# Patient Record
Sex: Male | Born: 1970 | Marital: Married | Smoking: Never smoker
Health system: Southern US, Community
[De-identification: ages and names within clinical notes are randomized; demographics above are authoritative.]

## PROBLEM LIST (undated history)

## (undated) DIAGNOSIS — C4491 Basal cell carcinoma of skin, unspecified: Secondary | ICD-10-CM

## (undated) DIAGNOSIS — I10 Essential (primary) hypertension: Secondary | ICD-10-CM

## (undated) DIAGNOSIS — F419 Anxiety disorder, unspecified: Secondary | ICD-10-CM

## (undated) DIAGNOSIS — E291 Testicular hypofunction: Secondary | ICD-10-CM

## (undated) HISTORY — PX: KNEE ARTHROSCOPY: SHX127

## (undated) HISTORY — DX: Basal cell carcinoma of skin, unspecified: C44.91

## (undated) HISTORY — DX: Testicular hypofunction: E29.1

## (undated) HISTORY — DX: Essential (primary) hypertension: I10

## (undated) HISTORY — DX: Anxiety disorder, unspecified: F41.9

## (undated) HISTORY — PX: VARICOCELE EXCISION: SUR582

---

## 2002-12-24 ENCOUNTER — Ambulatory Visit (HOSPITAL_BASED_OUTPATIENT_CLINIC_OR_DEPARTMENT_OTHER): Admission: RE | Admit: 2002-12-24 | Discharge: 2002-12-24 | Payer: Self-pay | Admitting: Urology

## 2002-12-24 ENCOUNTER — Ambulatory Visit (HOSPITAL_COMMUNITY): Admission: RE | Admit: 2002-12-24 | Discharge: 2002-12-24 | Payer: Self-pay | Admitting: Urology

## 2005-05-02 ENCOUNTER — Emergency Department (HOSPITAL_COMMUNITY): Admission: EM | Admit: 2005-05-02 | Discharge: 2005-05-02 | Payer: Self-pay | Admitting: Family Medicine

## 2006-04-17 ENCOUNTER — Emergency Department (HOSPITAL_COMMUNITY): Admission: EM | Admit: 2006-04-17 | Discharge: 2006-04-17 | Payer: Self-pay | Admitting: Family Medicine

## 2012-05-04 ENCOUNTER — Encounter (INDEPENDENT_AMBULATORY_CARE_PROVIDER_SITE_OTHER): Payer: Self-pay | Admitting: Surgery

## 2012-05-04 ENCOUNTER — Ambulatory Visit (INDEPENDENT_AMBULATORY_CARE_PROVIDER_SITE_OTHER): Payer: BC Managed Care – PPO | Admitting: Surgery

## 2012-05-04 VITALS — BP 130/82 | HR 60 | Temp 97.1°F | Resp 16 | Ht 72.0 in | Wt 219.0 lb

## 2012-05-04 DIAGNOSIS — K648 Other hemorrhoids: Secondary | ICD-10-CM

## 2012-05-04 MED ORDER — HYDROCORTISONE ACETATE 25 MG RE SUPP: 25.0000 mg | Freq: Two times a day (BID) | RECTAL | Status: DC

## 2012-05-04 NOTE — Patient Instructions (Signed)
ANORECTAL PROCEDURES: 1.  Tub soaks 2-3 times daily in warm water (may add Epsom salts if desired) 2.  Stool softener for one month (store brand Miralax or Colace) 3.  Avoid toilet paper - use baby wipes or Tucks pads 4.  Increase water intake - 6-8 glasses daily 5.  Apply dry pad to area until drainage stops 

## 2012-05-04 NOTE — Progress Notes (Signed)
General Surgery [facility]  Chief Complaint  Patient presents with  . New Evaluation    eval rectal bleeding    HISTORY: Patient is a 42 year old white male who presents on self referral with rectal bleeding. Patient has noted intermittent bleeding for 4-5 weeks associated with bowel movements. He denies pain. He has had no prior history of anorectal surgery. He denies fevers or chills. He presents today for evaluation.  History reviewed. No pertinent past medical history.   Current Outpatient Prescriptions  Medication Sig Dispense Refill  . Ascorbic Acid (VITAMIN C) 100 MG tablet Take 100 mg by mouth daily.      . folic acid (FOLVITE) 1 MG tablet Take 1 mg by mouth daily.      Marland Kitchen thiamine (VITAMIN B-1) 100 MG tablet Take 100 mg by mouth daily.      . vitamin E 100 UNIT capsule Take 100 Units by mouth daily.       No current facility-administered medications for this visit.     No Known Allergies   Family History  Problem Relation Age of Onset  . Cancer Paternal Aunt     breast  . Cancer Paternal Uncle     lung  . Cancer Paternal Uncle     brain  . Cancer Paternal Uncle     unknown to pt     History   Social History  . Marital Status: Married    Spouse Name: N/A    Number of Children: N/A  . Years of Education: N/A   Social History Main Topics  . Smoking status: Former Games developer  . Smokeless tobacco: Current User  . Alcohol Use: Yes  . Drug Use: No  . Sexually Active: None   Other Topics Concern  . None   Social History Narrative  . None     REVIEW OF SYSTEMS - PERTINENT POSITIVES ONLY: Leading with bowel movements, sometimes heavy. No bleeding between bowel movements. Denies fever. Denies pain.  EXAM: Filed Vitals:   05/04/12 1506  BP: 130/82  Pulse: 60  Temp: 97.1 F (36.2 C)  Resp: 16    HEENT: normocephalic; pupils equal and reactive; sclerae clear; dentition good; mucous membranes moist NECK:  symmetric on extension;  no palpable anterior or posterior cervical lymphadenopathy; no supraclavicular masses; no tenderness CHEST: clear to auscultation bilaterally without rales, rhonchi, or wheezes CARDIAC: regular rate and rhythm without significant murmur; peripheral pulses are full GU:  External exam shows normal anoderm. No sign of fistula or fissure. No significant external hemorrhoids. Digital rectal exam shows normal tone. No palpable masses. Soft brown stool present. Prostate is normal without nodule or mass. Anoscopy is performed and there are significant grade 2 internal hemorrhoids circumferentially. In the left lateral position there is a prominent hemorrhoidal column with active bleeding. Rubber band ligation is performed without discomfort. Bleeding is controlled. EXT:  non-tender without edema; no deformity NEURO: no gross focal deficits; no sign of tremor   LABORATORY RESULTS: See [facility] ([facility]Epic) for most recent results   RADIOLOGY RESULTS: See [facility] ([facility]Epic) for most recent results   IMPRESSION: Internal hemorrhoids, grade 2, with bleeding  PLAN: Patient is instructed in dietary changes for management of hemorrhoids. I have provided him with written literature to review. I am going to prescribe Anusol HC suppositories to use twice daily for 10 days. I have given him instructions for what to expect with rubber band ligation. I would like to evaluate him again in  4-6 weeks. Hopefully we can control his symptomatic hemorrhoids with office procedures. Otherwise he may become a candidate for the [facility] procedure as an outpatient surgery.  Patient will return for followup in 4 weeks.  Velora Heckler, MD, FACS General & Endocrine Surgery [facility]   Visit Diagnoses: 1. Hemorrhoids, internal, with bleeding     Primary Care Physician: No primary provider on file.

## 2012-06-07 ENCOUNTER — Ambulatory Visit (INDEPENDENT_AMBULATORY_CARE_PROVIDER_SITE_OTHER): Payer: BC Managed Care – PPO | Admitting: Surgery

## 2012-09-24 ENCOUNTER — Encounter: Payer: Self-pay | Admitting: Family Medicine

## 2012-09-24 ENCOUNTER — Ambulatory Visit (INDEPENDENT_AMBULATORY_CARE_PROVIDER_SITE_OTHER): Payer: BC Managed Care – PPO | Admitting: Family Medicine

## 2012-09-24 VITALS — BP 120/78 | HR 60 | Temp 98.6°F | Resp 14 | Ht 71.5 in | Wt 216.0 lb

## 2012-09-24 DIAGNOSIS — R5381 Other malaise: Secondary | ICD-10-CM

## 2012-09-24 DIAGNOSIS — M549 Dorsalgia, unspecified: Secondary | ICD-10-CM

## 2012-09-24 DIAGNOSIS — Z0000 Encounter for general adult medical examination without abnormal findings: Secondary | ICD-10-CM

## 2012-09-24 DIAGNOSIS — Z23 Encounter for immunization: Secondary | ICD-10-CM

## 2012-09-24 NOTE — Progress Notes (Signed)
Subjective:    Patient ID: Miguel Mathis, male    DOB: [birth year], 42 y.o.   MRN: [id number]  HPI Patient is a 42 year old white male here today to establish care and for physical exam.  He's been having mid back pain around the level of T12 the last month. The patient injured his back lifting a package at work. He didn't give it much thought initially. However now he complains of a burning aching pain in his back at times.  He denies any neuropathy, sciatica, leg weakness, or cauda equina symptoms. He also complains of just generalized fatigue. He has poor libido. He also has some anhedonia. He states he did not have much drive or motivation. Otherwise he feels well.  Past Medical History  Diagnosis Date  . Basal cell cancer    Past Surgical History  Procedure Laterality Date  . Knee arthroscopy     No current outpatient prescriptions on file prior to visit.   No current facility-administered medications on file prior to visit.   No Known Allergies History   Social History  . Marital Status: Married    Spouse Name: N/A    Number of Children: N/A  . Years of Education: N/A   Occupational History  . Not on file.   Social History Main Topics  . Smoking status: Never Smoker   . Smokeless tobacco: Current User    Types: Snuff  . Alcohol Use: 3.6 oz/week    6 Cans of beer per week  . Drug Use: No  . 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 Not on file   Social History Narrative  . No narrative on file   Family History  Problem Relation Age of Onset  . Cancer Paternal Aunt     breast  . Cancer Paternal Uncle     lung  . Cancer Paternal Uncle     brain  . Cancer Paternal Uncle     unknown to pt      Review of Systems  All other systems reviewed and are negative.       Objective:   Physical Exam  Vitals reviewed. Constitutional: He is oriented to person, place, and time. He appears well-developed and  well-nourished. No distress.  HENT:  Head: Normocephalic and atraumatic.  Right Ear: External ear normal.  Left Ear: External ear normal.  Nose: Nose normal.  Mouth/Throat: Oropharynx is clear and moist. No oropharyngeal exudate.  Eyes: Conjunctivae and EOM are normal. Pupils are equal, round, and reactive to light. Right eye exhibits no discharge. Left eye exhibits no discharge. No scleral icterus.  Neck: Normal range of motion. Neck supple. No JVD present. No tracheal deviation present. No thyromegaly present.  Cardiovascular: Normal rate, regular rhythm, normal heart sounds and intact distal pulses.  Exam reveals no gallop and no friction rub.   No murmur heard. Pulmonary/Chest: Effort normal and breath sounds normal. No stridor. No respiratory distress. He has no wheezes. He has no rales. He exhibits no tenderness.  Abdominal: Soft. Bowel sounds are normal. He exhibits no distension and no mass. There is no tenderness. There is no rebound and no guarding.  Genitourinary: Penis normal. Guaiac negative stool. No penile tenderness.  Musculoskeletal: Normal range of motion. He exhibits no edema and no tenderness.  Lymphadenopathy:    He has no cervical adenopathy.  Neurological: He is alert and oriented to person, place, and time. He has  normal reflexes. He displays normal reflexes. No cranial nerve deficit. He exhibits normal muscle tone. Coordination normal.  Skin: Skin is warm. No rash noted. He is not diaphoretic. No erythema. No pallor.  Psychiatric: He has a normal mood and affect. His behavior is normal. Judgment and thought content normal.          Assessment & Plan:  1. Routine general medical examination at a health care facility Physical exam is within normal limits. The patient would like to get a TdaP today and we will accommodate him. - Lipid panel; Future  2. Other malaise and fatigue Right the patient come back for some screening labs as listed below. I also did  discuss with the patient about possible pression. He states is a possibility that he would like to obtain the lab work first. - COMPLETE METABOLIC PANEL WITH GFR; Future - CBC with Differential; Future - TSH; Future - Testosterone; Future - Vitamin B12; Future - DG Thoracic Spine W/Swimmers; Future  3. Back pain, subacute Patient's history and physical are consistent with subacute back pain due to a muscle strain. I did recommend obtaining a thoracic spine x-ray to rule out degenerative disc disease or other skeletal pathology. - DG Thoracic Spine W/Swimmers; Future

## 2012-09-25 ENCOUNTER — Other Ambulatory Visit: Payer: BC Managed Care – PPO

## 2012-09-25 ENCOUNTER — Ambulatory Visit
Admission: RE | Admit: 2012-09-25 | Discharge: 2012-09-25 | Disposition: A | Discharge status: Home / Self Care | Payer: BC Managed Care – PPO | Source: Ambulatory Visit | Attending: Family Medicine | Admitting: Family Medicine

## 2012-09-25 DIAGNOSIS — Z0000 Encounter for general adult medical examination without abnormal findings: Secondary | ICD-10-CM

## 2012-09-25 DIAGNOSIS — R5381 Other malaise: Secondary | ICD-10-CM

## 2012-09-25 DIAGNOSIS — M549 Dorsalgia, unspecified: Secondary | ICD-10-CM

## 2012-09-25 LAB — LIPID PANEL
Cholesterol: 224 mg/dL — ABNORMAL HIGH (ref 0–200)
Total CHOL/HDL Ratio: 2.2 Ratio
VLDL: 11 mg/dL (ref 0–40)

## 2012-09-25 LAB — CBC WITH DIFFERENTIAL/PLATELET
Eosinophils Absolute: 0 10*3/uL (ref 0.0–0.7)
Eosinophils Relative: 1 % (ref 0–5)
HCT: 44.6 % (ref 39.0–52.0)
Hemoglobin: 16 g/dL (ref 13.0–17.0)
Lymphs Abs: 0.9 10*3/uL (ref 0.7–4.0)
MCH: 31.8 pg (ref 26.0–34.0)
MCV: 88.7 fL (ref 78.0–100.0)
Monocytes Relative: 14 % — ABNORMAL HIGH (ref 3–12)
Neutrophils Relative %: 67 % (ref 43–77)
RBC: 5.03 MIL/uL (ref 4.22–5.81)

## 2012-09-25 LAB — COMPLETE METABOLIC PANEL WITH GFR
ALT: 25 U/L (ref 0–53)
BUN: 14 mg/dL (ref 6–23)
CO2: 27 mEq/L (ref 19–32)
Chloride: 102 mEq/L (ref 96–112)
Glucose, Bld: 100 mg/dL — ABNORMAL HIGH (ref 70–99)
Potassium: 4.9 mEq/L (ref 3.5–5.3)
Sodium: 139 mEq/L (ref 135–145)

## 2012-09-25 LAB — TESTOSTERONE: Testosterone: 419 ng/dL (ref 300–890)

## 2012-09-29 ENCOUNTER — Encounter: Payer: Self-pay | Admitting: Family Medicine

## 2014-11-08 IMAGING — CR DG THORACIC SPINE 3V
3 series · 3 of 3 positions shown · non-contrast
Comparison: None.

CLINICAL DATA: Mid back pain without trauma

THORACIC SPINE - 2 VIEW + SWIMMERS

[t t-spine a.p.]
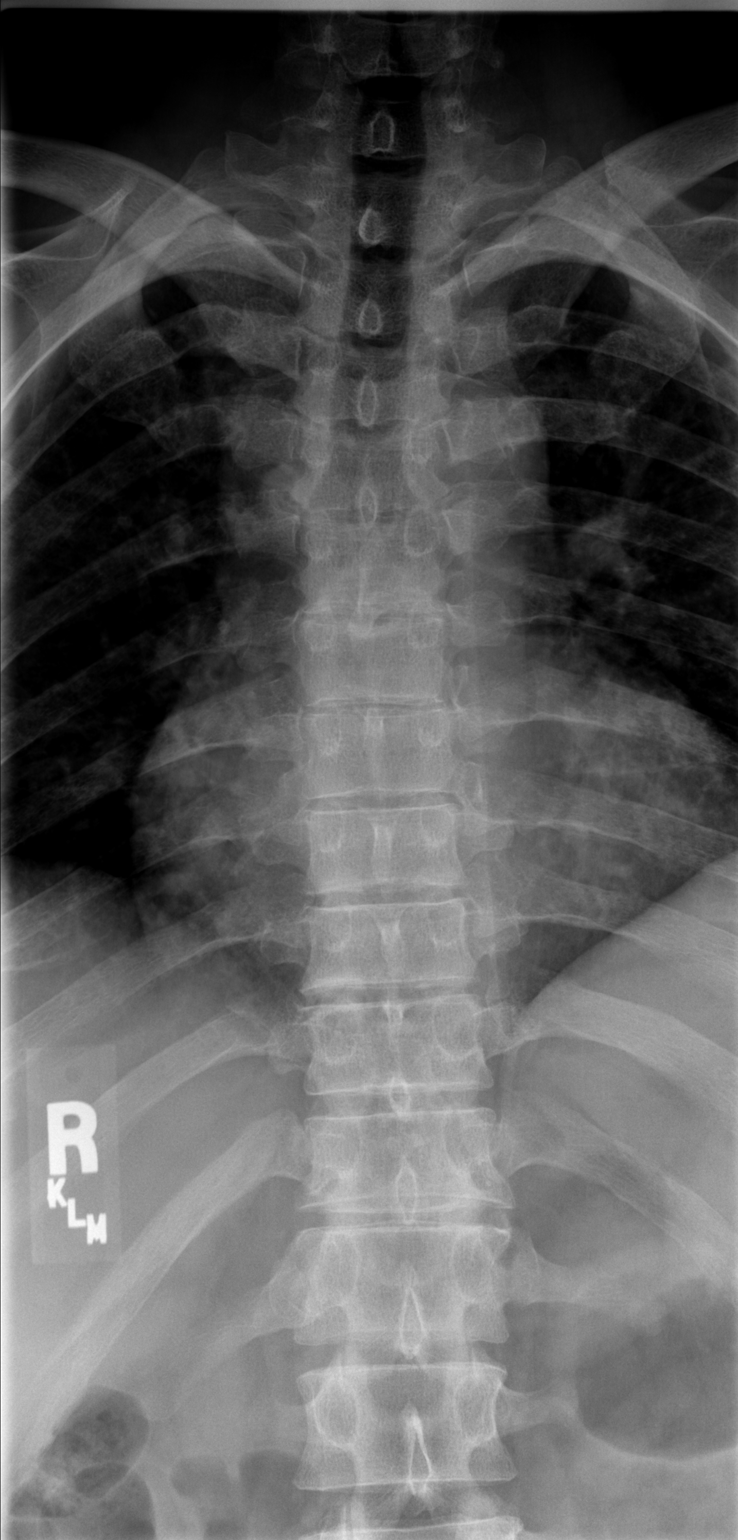

[t t-spine lat *]
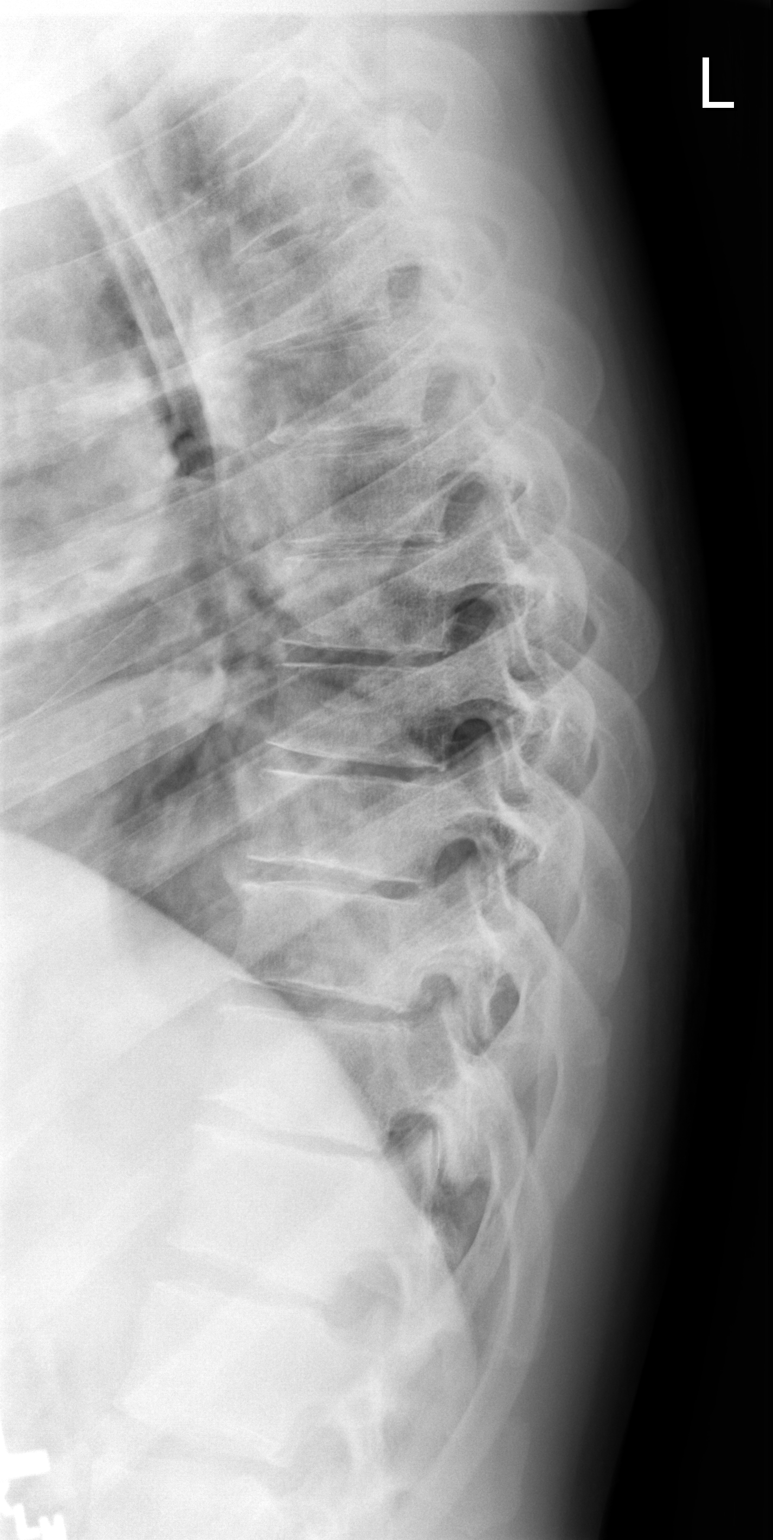

[t swimmers]
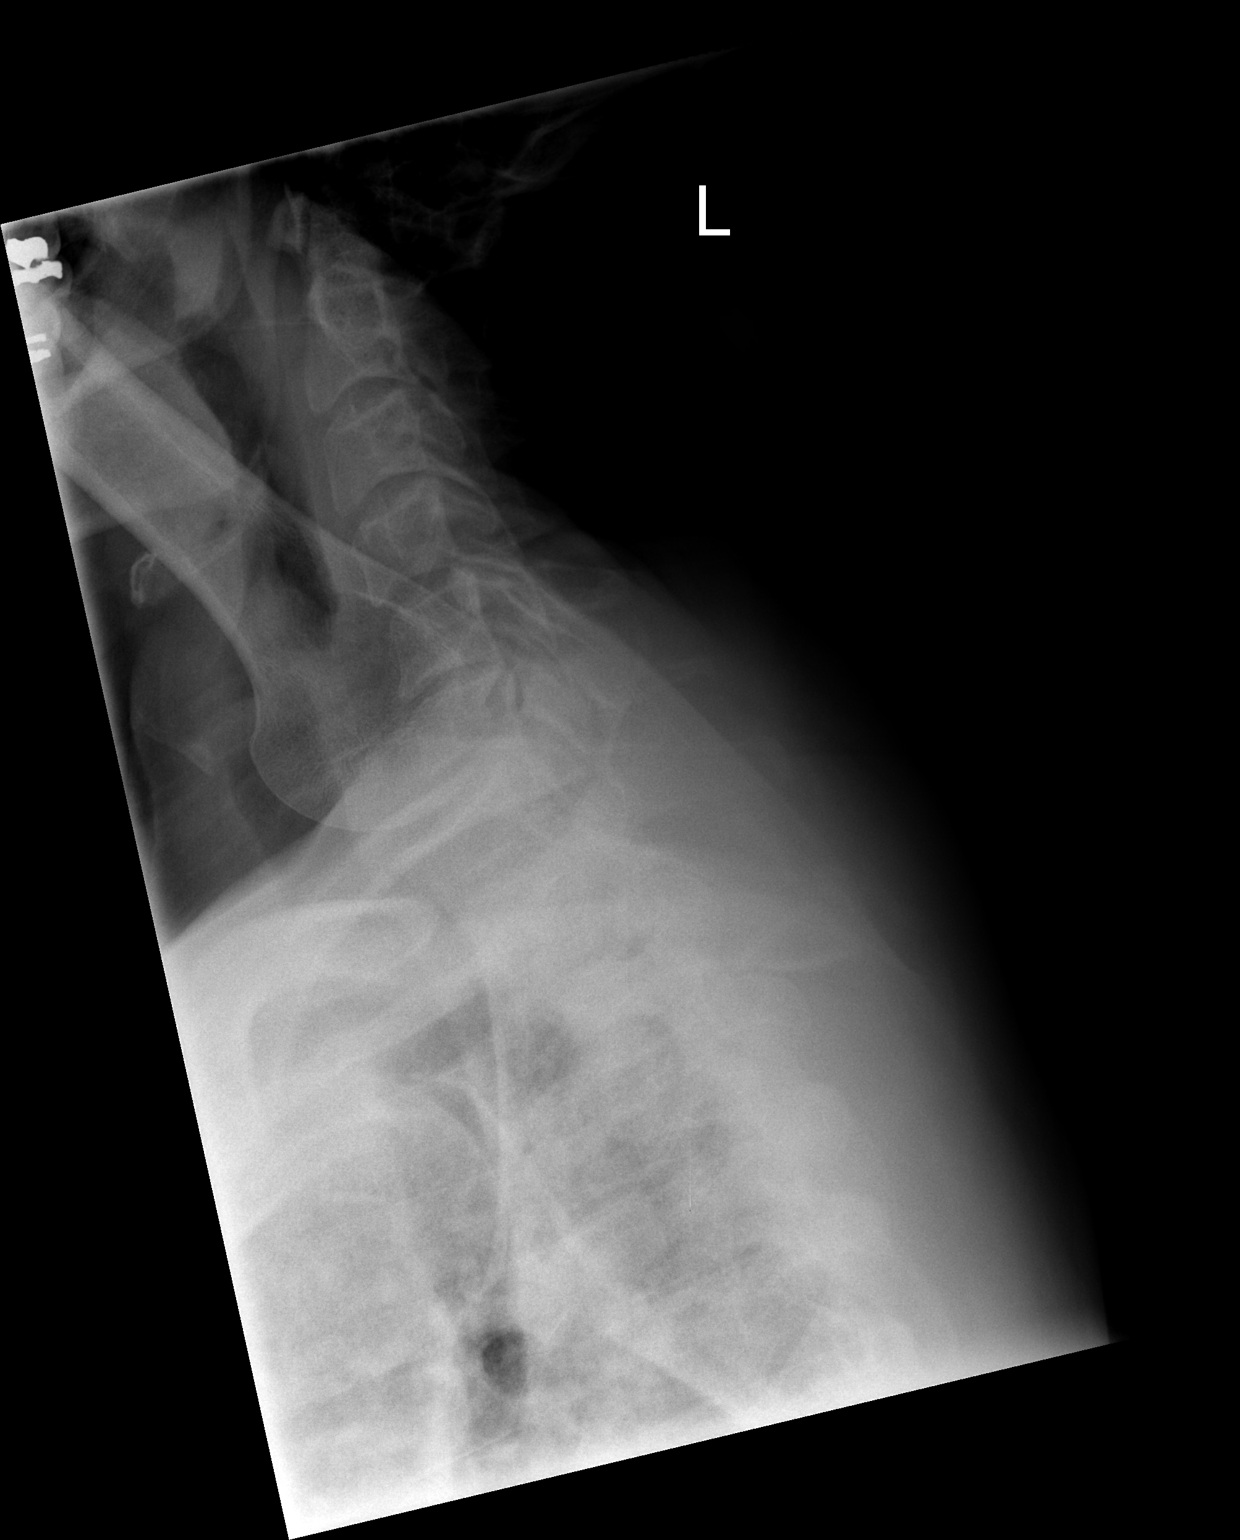

[3 of 3 positions shown; findings below may reference images not displayed]

FINDINGS: Minimal convex right thoracic spine curvature.  Normal
paraspinous contours.

The lateral view images from approximately mid T2 through bottom of
L1.  Maintenance of vertebral body height across these levels.
Mild multilevel spondylosis.  The swimmer's view demonstrates
grossly maintained T1 vertebral body height.
IMPRESSION: Mild spondylosis and spinal curvature. No acute osseous
abnormality.

## 2015-09-16 ENCOUNTER — Other Ambulatory Visit: Payer: Self-pay

## 2015-09-18 ENCOUNTER — Encounter: Payer: Self-pay | Admitting: Family Medicine

## 2015-10-16 ENCOUNTER — Other Ambulatory Visit: Payer: BLUE CROSS/BLUE SHIELD

## 2015-10-16 DIAGNOSIS — Z0000 Encounter for general adult medical examination without abnormal findings: Secondary | ICD-10-CM

## 2015-10-16 LAB — COMPLETE METABOLIC PANEL WITH GFR
ALBUMIN: 4.4 g/dL (ref 3.6–5.1)
ALK PHOS: 70 U/L (ref 40–115)
ALT: 44 U/L (ref 9–46)
AST: 24 U/L (ref 10–40)
BILIRUBIN TOTAL: 0.8 mg/dL (ref 0.2–1.2)
BUN: 22 mg/dL (ref 7–25)
CO2: 24 mmol/L (ref 20–31)
CREATININE: 1.11 mg/dL (ref 0.60–1.35)
Calcium: 9.4 mg/dL (ref 8.6–10.3)
Chloride: 103 mmol/L (ref 98–110)
GFR, Est African American: >89 mL/min (ref >=60)
GFR, Est Non African American: 80 mL/min (ref >=60)
GLUCOSE: 91 mg/dL (ref 70–99)
POTASSIUM: 4.5 mmol/L (ref 3.5–5.3)
SODIUM: 136 mmol/L (ref 135–146)
TOTAL PROTEIN: 7 g/dL (ref 6.1–8.1)

## 2015-10-16 LAB — CBC WITH DIFFERENTIAL/PLATELET
Basophils Absolute: 0 cells/uL (ref 0–200)
Basophils Relative: 0 %
EOS PCT: 2 %
Eosinophils Absolute: 100 cells/uL (ref 15–500)
HCT: 46.6 % (ref 38.5–50.0)
HEMOGLOBIN: 16.4 g/dL (ref 13.0–17.0)
LYMPHS ABS: 1650 {cells}/uL (ref 850–3900)
Lymphocytes Relative: 33 %
MCH: 32 pg (ref 27.0–33.0)
MCHC: 35.2 g/dL (ref 32.0–36.0)
MCV: 90.8 fL (ref 80.0–100.0)
MPV: 10.8 fL (ref 7.5–12.5)
Monocytes Absolute: 750 cells/uL (ref 200–950)
Monocytes Relative: 15 %
NEUTROS ABS: 2500 {cells}/uL (ref 1500–7800)
Neutrophils Relative %: 50 %
PLATELETS: 216 10*3/uL (ref 140–400)
RBC: 5.13 MIL/uL (ref 4.20–5.80)
RDW: 13.8 % (ref 11.0–15.0)
WBC: 5 10*3/uL (ref 3.8–10.8)

## 2015-10-16 LAB — LIPID PANEL
Cholesterol: 241 mg/dL — ABNORMAL HIGH (ref 125–200)
HDL: 77 mg/dL (ref >=40)
LDL CALC: 140 mg/dL — AB (ref <130)
Total CHOL/HDL Ratio: 3.1 Ratio (ref <=5.0)
Triglycerides: 118 mg/dL (ref <150)
VLDL: 24 mg/dL (ref <30)

## 2015-10-16 LAB — TSH: TSH: 1.33 mIU/L (ref 0.40–4.50)

## 2015-10-20 ENCOUNTER — Encounter: Payer: Self-pay | Admitting: Family Medicine

## 2015-10-20 ENCOUNTER — Ambulatory Visit (INDEPENDENT_AMBULATORY_CARE_PROVIDER_SITE_OTHER): Payer: BLUE CROSS/BLUE SHIELD | Admitting: Family Medicine

## 2015-10-20 VITALS — BP 126/90 | HR 64 | Temp 98.2°F | Resp 18 | Ht 72.0 in | Wt 250.0 lb

## 2015-10-20 DIAGNOSIS — Z0000 Encounter for general adult medical examination without abnormal findings: Secondary | ICD-10-CM | POA: Diagnosis not present

## 2015-10-20 NOTE — Progress Notes (Signed)
Subjective:    Patient ID: Miguel Mathis, male    DOB: Oct 29, 1970, 45 y.o.   MRN: [id number]  HPI Patient is a 45 year old white male here today for physical exam.  His wife recently had a baby. Unfortunately the baby is in the NICU with a pneumothorax, hypoglycemia, and infection. I will see the patient is very concerned about this. Since last time I saw the patient, he has gained 34 pounds. He is started a job that is very sedentary. He is not exercising. His blood pressure has increased. His LDL closer has increased as well is evidence to his lab work below. His HDL cholesterol has dropped although it is still excellent. He does complain of daily headaches. The patient was sitting in a chair approximately 4 weeks ago when the chair suddenly broke for no reason causing the patient to fall to the ground and strike his head on a table. The injury occurred to his occiput. He is still tender to palpation in that area. He was momentarily stunned although he denies any loss of consciousness. Since that time he has had headaches. The headaches are worse with strenuous activity and concentration and stress. They're pulsatile in nature. They improve with rest. They sound almost migraine in nature like the patient may have suffered a postconcussive headache/disorder. He also complains of fluid in his inner ear. On examination today the ear canals are clear. He has a small middle ear effusion  Past Medical History:  Diagnosis Date  . Basal cell cancer    Past Surgical History:  Procedure Laterality Date  . KNEE ARTHROSCOPY     No current outpatient prescriptions on file prior to visit.   No current facility-administered medications on file prior to visit.    No Known Allergies Social History   Social History  . Marital status: Married    Spouse name: N/A  . Number of children: N/A  . Years of education: N/A   Occupational History  . Not on file.   Social History Main Topics  . Smoking status:  Never Smoker  . Smokeless tobacco: Current User    Types: Snuff  . Alcohol use 3.6 oz/week    6 Cans of beer per week  . Drug use: No  . 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 Not on file   Social History Narrative  . No narrative on file   Family History  Problem Relation Age of Onset  . Cancer Paternal Aunt     breast  . Cancer Paternal Uncle     lung  . Cancer Paternal Uncle     brain  . Cancer Paternal Uncle     unknown to pt      Review of Systems  All other systems reviewed and are negative.      Objective:   Physical Exam  Constitutional: He is oriented to person, place, and time. He appears well-developed and well-nourished. No distress.  HENT:  Head: Normocephalic and atraumatic.  Right Ear: External ear normal.  Left Ear: External ear normal.  Nose: Nose normal.  Mouth/Throat: Oropharynx is clear and moist. No oropharyngeal exudate.  Eyes: Conjunctivae and EOM are normal. Pupils are equal, round, and reactive to light. Right eye exhibits no discharge. Left eye exhibits no discharge. No scleral icterus.  Neck: Normal range of motion. Neck supple. No JVD present. No tracheal deviation present. No thyromegaly present.  Cardiovascular: Normal  rate, regular rhythm, normal heart sounds and intact distal pulses.  Exam reveals no gallop and no friction rub.   No murmur heard. Pulmonary/Chest: Effort normal and breath sounds normal. No stridor. No respiratory distress. He has no wheezes. He has no rales. He exhibits no tenderness.  Abdominal: Soft. Bowel sounds are normal. He exhibits no distension and no mass. There is no tenderness. There is no rebound and no guarding.  Genitourinary: Penis normal. Rectal exam shows guaiac negative stool. No penile tenderness.  Musculoskeletal: Normal range of motion. He exhibits no edema or tenderness.  Lymphadenopathy:    He has no cervical adenopathy.  Neurological: He is  alert and oriented to person, place, and time. He has normal reflexes. No cranial nerve deficit. He exhibits normal muscle tone. Coordination normal.  Skin: Skin is warm. No rash noted. He is not diaphoretic. No erythema. No pallor.  Psychiatric: He has a normal mood and affect. His behavior is normal. Judgment and thought content normal.  Vitals reviewed.         Assessment & Plan:  Routine general medical examination at a health care facility  Physical exam is significant for weight gain of 34 pounds, borderline blood pressure, and elevated LDL cholesterol. I have recommended 30 minutes a day 5 days a week of aerobic exercise, 15-20 pounds weight loss, and a low sodium diet to address. I will recheck labs in 6 months. The remainder of his lab work is normal. I believe he suffered a concussion when he fell and now has a postconcussive headache. I recommended tincture of time and rest. Should the headache intensify, I would proceed with a CT scan although based on his normal neurologic exam I believe the risk of an intercranial hemorrhage is virtually 0 given the length of time since the injury. I recommended Flonase and Zyrtec for middle ear effusion. If no better consider an ENT referral for tympanostomy tube placement

## 2016-04-04 ENCOUNTER — Ambulatory Visit (INDEPENDENT_AMBULATORY_CARE_PROVIDER_SITE_OTHER): Payer: BLUE CROSS/BLUE SHIELD | Admitting: Family Medicine

## 2016-04-04 ENCOUNTER — Encounter: Payer: Self-pay | Admitting: Family Medicine

## 2016-04-04 VITALS — BP 160/100 | HR 74 | Temp 98.4°F | Resp 16 | Ht 72.0 in | Wt 251.0 lb

## 2016-04-04 DIAGNOSIS — R5383 Other fatigue: Secondary | ICD-10-CM

## 2016-04-04 MED ORDER — FLUTICASONE PROPIONATE 50 MCG/ACT NA SUSP: 2.0000 | Freq: Every day | NASAL | 6 refills | Status: DC

## 2016-04-04 MED ORDER — HYDROCHLOROTHIAZIDE 25 MG PO TABS: 25.0000 mg | ORAL_TABLET | Freq: Every day | ORAL | 3 refills | Status: DC

## 2016-04-04 NOTE — Progress Notes (Signed)
Subjective:    Patient ID: Miguel Mathis, male    DOB: 10/07/1970, 46 y.o.   MRN: [id number]  HPI  10/2015 Patient is a 46 year old white male here today for physical exam.  His wife recently had a baby. Unfortunately the baby is in the NICU with a pneumothorax, hypoglycemia, and infection. I will see the patient is very concerned about this. Since last time I saw the patient, he has gained 34 pounds. He is started a job that is very sedentary. He is not exercising. His blood pressure has increased. His LDL closer has increased as well is evidence to his lab work below. His HDL cholesterol has dropped although it is still excellent. He does complain of daily headaches. The patient was sitting in a chair approximately 4 weeks ago when the chair suddenly broke for no reason causing the patient to fall to the ground and strike his head on a table. The injury occurred to his occiput. He is still tender to palpation in that area. He was momentarily stunned although he denies any loss of consciousness. Since that time he has had headaches. The headaches are worse with strenuous activity and concentration and stress. They're pulsatile in nature. They improve with rest. They sound almost migraine in nature like the patient may have suffered a postconcussive headache/disorder. He also complains of fluid in his inner ear. On examination today the ear canals are clear. He has a small middle ear effusion.  At that time, my plan was: Physical exam is significant for weight gain of 34 pounds, borderline blood pressure, and elevated LDL cholesterol. I have recommended 30 minutes a day 5 days a week of aerobic exercise, 15-20 pounds weight loss, and a low sodium diet to address. I will recheck labs in 6 months. The remainder of his lab work is normal. I believe he suffered a concussion when he fell and now has a postconcussive headache. I recommended tincture of time and rest. Should the headache intensify, I would proceed  with a CT scan although based on his normal neurologic exam I believe the risk of an intercranial hemorrhage is virtually 0 given the length of time since the injury. I recommended Flonase and Zyrtec for middle ear effusion. If no better consider an ENT referral for tympanostomy tube placement  04/04/16 Went to another doctor and ws started on bystolic 5 mg poqday.  Has failed lisinopril due to cough in past and valsartan caused angioedema.  On bystolic, reports ED, fatigue, and BP is 140's/90's  Past Medical History:  Diagnosis Date  . Basal cell cancer    Past Surgical History:  Procedure Laterality Date  . KNEE ARTHROSCOPY     No current outpatient prescriptions on file prior to visit.   No current facility-administered medications on file prior to visit.    No Known Allergies Social History   Social History  . Marital status: Married    Spouse name: N/A  . Number of children: N/A  . Years of education: N/A   Occupational History  . Not on file.   Social History Main Topics  . Smoking status: Never Smoker  . Smokeless tobacco: Current User    Types: Snuff  . Alcohol use 3.6 oz/week    6 Cans of beer per week  . Drug use: No  . 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 Not on file   Social History Narrative  .  No narrative on file   Family History  Problem Relation Age of Onset  . Cancer Paternal Aunt     breast  . Cancer Paternal Uncle     lung  . Cancer Paternal Uncle     brain  . Cancer Paternal Uncle     unknown to pt      Review of Systems  All other systems reviewed and are negative.      Objective:   Physical Exam  Constitutional: He is oriented to person, place, and time. He appears well-developed and well-nourished. No distress.  HENT:  Head: Normocephalic and atraumatic.  Right Ear: External ear normal.  Left Ear: External ear normal.  Nose: Nose normal.  Mouth/Throat: Oropharynx is  clear and moist. No oropharyngeal exudate.  Eyes: Conjunctivae and EOM are normal. Pupils are equal, round, and reactive to light. Right eye exhibits no discharge. Left eye exhibits no discharge. No scleral icterus.  Neck: Normal range of motion. Neck supple. No JVD present. No tracheal deviation present. No thyromegaly present.  Cardiovascular: Normal rate, regular rhythm, normal heart sounds and intact distal pulses.  Exam reveals no gallop and no friction rub.   No murmur heard. Pulmonary/Chest: Effort normal and breath sounds normal. No stridor. No respiratory distress. He has no wheezes. He has no rales. He exhibits no tenderness.  Abdominal: Soft. Bowel sounds are normal. He exhibits no distension and no mass. There is no tenderness. There is no rebound and no guarding.  Genitourinary: Penis normal. Rectal exam shows guaiac negative stool. No penile tenderness.  Musculoskeletal: Normal range of motion. He exhibits no edema or tenderness.  Lymphadenopathy:    He has no cervical adenopathy.  Neurological: He is alert and oriented to person, place, and time. He has normal reflexes. No cranial nerve deficit. He exhibits normal muscle tone. Coordination normal.  Skin: Skin is warm. No rash noted. He is not diaphoretic. No erythema. No pallor.  Psychiatric: He has a normal mood and affect. His behavior is normal. Judgment and thought content normal.  Vitals reviewed.         Assessment & Plan:   Other fatigue - Plan: CBC with Differential/Platelet, COMPLETE METABOLIC PANEL WITH GFR, Testosterone Total,Free,Bio, Males  DC bystolic.  Start hctz 25 mg poqday and recheck bp in 3-4 weeks.  Check cbc, cmp, testosterone.

## 2016-04-05 LAB — CBC WITH DIFFERENTIAL/PLATELET
BASOS PCT: 0 %
Basophils Absolute: 0 cells/uL (ref 0–200)
EOS PCT: 1 %
Eosinophils Absolute: 74 cells/uL (ref 15–500)
HCT: 46.4 % (ref 38.5–50.0)
HEMOGLOBIN: 16.3 g/dL (ref 13.0–17.0)
LYMPHS ABS: 1702 {cells}/uL (ref 850–3900)
Lymphocytes Relative: 23 %
MCH: 32.2 pg (ref 27.0–33.0)
MCHC: 35.1 g/dL (ref 32.0–36.0)
MCV: 91.7 fL (ref 80.0–100.0)
MPV: 10.7 fL (ref 7.5–12.5)
Monocytes Absolute: 1036 cells/uL — ABNORMAL HIGH (ref 200–950)
Monocytes Relative: 14 %
NEUTROS PCT: 62 %
Neutro Abs: 4588 cells/uL (ref 1500–7800)
Platelets: 241 10*3/uL (ref 140–400)
RBC: 5.06 MIL/uL (ref 4.20–5.80)
RDW: 14 % (ref 11.0–15.0)
WBC: 7.4 10*3/uL (ref 3.8–10.8)

## 2016-04-05 LAB — COMPLETE METABOLIC PANEL WITH GFR
ALBUMIN: 4.5 g/dL (ref 3.6–5.1)
ALK PHOS: 73 U/L (ref 40–115)
ALT: 41 U/L (ref 9–46)
AST: 22 U/L (ref 10–40)
BUN: 20 mg/dL (ref 7–25)
CHLORIDE: 106 mmol/L (ref 98–110)
CO2: 25 mmol/L (ref 20–31)
Calcium: 9.7 mg/dL (ref 8.6–10.3)
Creat: 1.12 mg/dL (ref 0.60–1.35)
GFR, Est African American: >89 mL/min (ref >=60)
GFR, Est Non African American: 79 mL/min (ref >=60)
Glucose, Bld: 106 mg/dL — ABNORMAL HIGH (ref 70–99)
POTASSIUM: 4.3 mmol/L (ref 3.5–5.3)
SODIUM: 141 mmol/L (ref 135–146)
Total Bilirubin: 0.6 mg/dL (ref 0.2–1.2)
Total Protein: 7.7 g/dL (ref 6.1–8.1)

## 2016-04-05 LAB — TESTOSTERONE TOTAL,FREE,BIO, MALES
ALBUMIN: 4.5 g/dL (ref 3.6–5.1)
SEX HORMONE BINDING: 29 nmol/L (ref 10–50)
TESTOSTERONE: 276 ng/dL (ref 250–827)
Testosterone, Bioavailable: 79.1 ng/dL — ABNORMAL LOW (ref 110.0–575.0)
Testosterone, Free: 38.5 pg/mL — ABNORMAL LOW (ref 46.0–224.0)

## 2016-05-06 ENCOUNTER — Encounter: Payer: Self-pay | Admitting: Family Medicine

## 2016-05-06 ENCOUNTER — Ambulatory Visit (INDEPENDENT_AMBULATORY_CARE_PROVIDER_SITE_OTHER): Payer: BLUE CROSS/BLUE SHIELD | Admitting: Family Medicine

## 2016-05-06 ENCOUNTER — Ambulatory Visit (INDEPENDENT_AMBULATORY_CARE_PROVIDER_SITE_OTHER): Payer: BLUE CROSS/BLUE SHIELD

## 2016-05-06 VITALS — BP 138/90 | HR 72 | Temp 98.2°F | Resp 16 | Ht 72.0 in | Wt 250.0 lb

## 2016-05-06 DIAGNOSIS — E291 Testicular hypofunction: Secondary | ICD-10-CM | POA: Diagnosis not present

## 2016-05-06 DIAGNOSIS — R7989 Other specified abnormal findings of blood chemistry: Secondary | ICD-10-CM

## 2016-05-06 MED ORDER — TESTOSTERONE CYPIONATE 200 MG/ML IM SOLN: 200.0000 mg | INTRAMUSCULAR | 2 refills | Status: DC

## 2016-05-06 MED ORDER — TESTOSTERONE CYPIONATE 200 MG/ML IM SOLN
200.0000 mg | Freq: Once | INTRAMUSCULAR | Status: AC
  Administered 2016-05-06: 200 mg via INTRAMUSCULAR

## 2016-05-06 NOTE — Progress Notes (Signed)
Subjective:    Patient ID: Miguel Mathis, male    DOB: August 25, 1970, 46 y.o.   MRN: [id number]  Hypertension     10/2015 Patient is a 46 year old white male here today for physical exam.  His wife recently had a baby. Unfortunately the baby is in the NICU with a pneumothorax, hypoglycemia, and infection. I will see the patient is very concerned about this. Since last time I saw the patient, he has gained 34 pounds. He is started a job that is very sedentary. He is not exercising. His blood pressure has increased. His LDL closer has increased as well is evidence to his lab work below. His HDL cholesterol has dropped although it is still excellent. He does complain of daily headaches. The patient was sitting in a chair approximately 4 weeks ago when the chair suddenly broke for no reason causing the patient to fall to the ground and strike his head on a table. The injury occurred to his occiput. He is still tender to palpation in that area. He was momentarily stunned although he denies any loss of consciousness. Since that time he has had headaches. The headaches are worse with strenuous activity and concentration and stress. They're pulsatile in nature. They improve with rest. They sound almost migraine in nature like the patient may have suffered a postconcussive headache/disorder. He also complains of fluid in his inner ear. On examination today the ear canals are clear. He has a small middle ear effusion.  At that time, my plan was: Physical exam is significant for weight gain of 34 pounds, borderline blood pressure, and elevated LDL cholesterol. I have recommended 30 minutes a day 5 days a week of aerobic exercise, 15-20 pounds weight loss, and a low sodium diet to address. I will recheck labs in 6 months. The remainder of his lab work is normal. I believe he suffered a concussion when he fell and now has a postconcussive headache. I recommended tincture of time and rest. Should the headache intensify, I  would proceed with a CT scan although based on his normal neurologic exam I believe the risk of an intercranial hemorrhage is virtually 0 given the length of time since the injury. I recommended Flonase and Zyrtec for middle ear effusion. If no better consider an ENT referral for tympanostomy tube placement  04/04/16 Went to another doctor and ws started on bystolic 5 mg poqday.  Has failed lisinopril due to cough in past and valsartan caused angioedema.  On bystolic, reports ED, fatigue, and BP is 140's/90's.  At that time, my plan was: DC bystolic.  Start hctz 25 mg poqday and recheck bp in 3-4 weeks.  Check cbc, cmp, testosterone.    Office Visit on 04/04/2016  Component Date Value Ref Range Status  . WBC 04/04/2016 7.4  3.8 - 10.8 K/uL Final  . RBC 04/04/2016 5.06  4.20 - 5.80 MIL/uL Final  . Hemoglobin 04/04/2016 16.3  13.0 - 17.0 g/dL Final  . HCT 04/04/2016 46.4  38.5 - 50.0 % Final  . MCV 04/04/2016 91.7  80.0 - 100.0 fL Final  . MCH 04/04/2016 32.2  27.0 - 33.0 pg Final  . MCHC 04/04/2016 35.1  32.0 - 36.0 g/dL Final  . RDW 04/04/2016 14.0  11.0 - 15.0 % Final  . Platelets 04/04/2016 241  140 - 400 K/uL Final  . MPV 04/04/2016 10.7  7.5 - 12.5 fL Final  . Neutro Abs 04/04/2016 4588  1,500 - 7,800 cells/uL Final  . Lymphs  Abs 04/04/2016 1702  850 - 3,900 cells/uL Final  . Monocytes Absolute 04/04/2016 1036* 200 - 950 cells/uL Final  . Eosinophils Absolute 04/04/2016 74  15 - 500 cells/uL Final  . Basophils Absolute 04/04/2016 0  0 - 200 cells/uL Final  . Neutrophils Relative % 04/04/2016 62  % Final  . Lymphocytes Relative 04/04/2016 23  % Final  . Monocytes Relative 04/04/2016 14  % Final  . Eosinophils Relative 04/04/2016 1  % Final  . Basophils Relative 04/04/2016 0  % Final  . Smear Review 04/04/2016 Criteria for review not met   Final  . Sodium 04/04/2016 141  135 - 146 mmol/L Final  . Potassium 04/04/2016 4.3  3.5 - 5.3 mmol/L Final  . Chloride 04/04/2016 106  98 - 110  mmol/L Final  . CO2 04/04/2016 25  20 - 31 mmol/L Final  . Glucose, Bld 04/04/2016 106* 70 - 99 mg/dL Final  . BUN 04/04/2016 20  7 - 25 mg/dL Final  . Creat 04/04/2016 1.12  0.60 - 1.35 mg/dL Final  . Total Bilirubin 04/04/2016 0.6  0.2 - 1.2 mg/dL Final  . Alkaline Phosphatase 04/04/2016 73  40 - 115 U/L Final  . AST 04/04/2016 22  10 - 40 U/L Final  . ALT 04/04/2016 41  9 - 46 U/L Final  . Total Protein 04/04/2016 7.7  6.1 - 8.1 g/dL Final  . Albumin 04/04/2016 4.5  3.6 - 5.1 g/dL Final  . Calcium 04/04/2016 9.7  8.6 - 10.3 mg/dL Final  . GFR, Est African American 04/04/2016 >89  >=60 mL/min Final  . GFR, Est Non African American 04/04/2016 79  >=60 mL/min Final  . Testosterone 04/04/2016 276  250 - 827 ng/dL Final  . Albumin 04/04/2016 4.5  3.6 - 5.1 g/dL Final  . Sex Hormone Binding 04/04/2016 29  10 - 50 nmol/L Final  . Testosterone, Free 04/04/2016 38.5* 46.0 - 224.0 pg/mL Final  . Testosterone, Bioavailable 04/04/2016 79.1* 110.0 - 575.0 ng/dL Final   05/06/16 Here for follow up.  His blood pressure today is borderline on hydrochlorothiazide but is much better. He also feels better than he did on bystolic however he continues to have erectile problems, fatigue, poor libido. Testosterone levels were extremely low. He is interested in testosterone replacement. He also reports occasional panic attacks and daily anxiety.  Bystolic seem to help this when he took it however he did not like the way the medicine made him feel. 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 He is interested in possible options to treat anxiety   Past Medical History:  Diagnosis Date  . Basal cell cancer    Past Surgical History:  Procedure Laterality Date  . KNEE ARTHROSCOPY     Current Outpatient Prescriptions on File Prior to Visit  Medication Sig Dispense Refill  . fluticasone (FLONASE) 50 MCG/ACT nasal spray Place 2 sprays into both nostrils daily. 16 g 6  . hydrochlorothiazide (HYDRODIURIL)  25 MG tablet Take 1 tablet (25 mg total) by mouth daily. 90 tablet 3   No current facility-administered medications on file prior to visit.    Allergies  Allergen Reactions  . Diovan [Valsartan] Swelling   Social History   Social History  . Marital status: Married    Spouse name: N/A  . Number of children: N/A  . Years of education: N/A   Occupational History  . Not on file.   Social History Main Topics  . Smoking status: Never Smoker  .  Smokeless tobacco: Current User    Types: Snuff  . Alcohol use 3.6 oz/week    6 Cans of beer per week  . Drug use: No  . 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 Not on file   Social History Narrative  . No narrative on file   Family History  Problem Relation Age of Onset  . Cancer Paternal Aunt     breast  . Cancer Paternal Uncle     lung  . Cancer Paternal Uncle     brain  . Cancer Paternal Uncle     unknown to pt      Review of Systems  All other systems reviewed and are negative.      Objective:   Physical Exam  Constitutional: He is oriented to person, place, and time. He appears well-developed and well-nourished. No distress.  HENT:  Head: Normocephalic and atraumatic.  Right Ear: External ear normal.  Left Ear: External ear normal.  Nose: Nose normal.  Mouth/Throat: Oropharynx is clear and moist. No oropharyngeal exudate.  Eyes: Conjunctivae and EOM are normal. Pupils are equal, round, and reactive to light. Right eye exhibits no discharge. Left eye exhibits no discharge. No scleral icterus.  Neck: Normal range of motion. Neck supple. No JVD present. No tracheal deviation present. No thyromegaly present.  Cardiovascular: Normal rate, regular rhythm, normal heart sounds and intact distal pulses.  Exam reveals no gallop and no friction rub.   No murmur heard. Pulmonary/Chest: Effort normal and breath sounds normal. No stridor. No respiratory distress. He has no  wheezes. He has no rales. He exhibits no tenderness.  Abdominal: Soft. Bowel sounds are normal. He exhibits no distension and no mass. There is no tenderness. There is no rebound and no guarding.  Genitourinary: Penis normal. Rectal exam shows guaiac negative stool. No penile tenderness.  Musculoskeletal: Normal range of motion. He exhibits no edema or tenderness.  Lymphadenopathy:    He has no cervical adenopathy.  Neurological: He is alert and oriented to person, place, and time. He has normal reflexes. No cranial nerve deficit. He exhibits normal muscle tone. Coordination normal.  Skin: Skin is warm. No rash noted. He is not diaphoretic. No erythema. No pallor.  Psychiatric: He has a normal mood and affect. His behavior is normal. Judgment and thought content normal.  Vitals reviewed.         Assessment & Plan:   Hypogonadism in male - Plan: testosterone cypionate (DEPOTESTOSTERONE CYPIONATE) 200 MG/ML injection, Testosterone Total,Free,Bio, Males  Low pressures borderline. I think with increased exercise and weight loss his blood pressure will be at goal. I'm hoping that by replacing his testosterone with testosterone cypionate 200 mg IM every 2 weeks, patient will have more energy and drive and can start exercising more which managed to help his blood pressure. I will repeat a testosterone level today to verify and then I will start him on testosterone replacement. We discussed the risk of cardiovascular disease, prostate cancer, and low sperm counts. He can consider starting something like Lexapro for anxiety but I would first like and start testosterone and see how he feels on the medication before making any further changes

## 2016-05-09 LAB — TESTOSTERONE TOTAL,FREE,BIO, MALES
Albumin: 5 g/dL (ref 3.6–5.1)
Sex Hormone Binding: 33 nmol/L (ref 10–50)
TESTOSTERONE BIOAVAILABLE: 104.1 ng/dL — AB (ref 110.0–575.0)
TESTOSTERONE FREE: 45.8 pg/mL — AB (ref 46.0–224.0)
Testosterone: 365 ng/dL (ref 250–827)

## 2016-05-09 NOTE — Progress Notes (Signed)
Patient tolerated well.

## 2016-05-11 ENCOUNTER — Encounter: Payer: Self-pay | Admitting: Family Medicine

## 2016-08-12 ENCOUNTER — Ambulatory Visit: Payer: BLUE CROSS/BLUE SHIELD | Admitting: Family Medicine

## 2016-08-15 ENCOUNTER — Ambulatory Visit (INDEPENDENT_AMBULATORY_CARE_PROVIDER_SITE_OTHER): Payer: BLUE CROSS/BLUE SHIELD | Admitting: Family Medicine

## 2016-08-15 ENCOUNTER — Encounter: Payer: Self-pay | Admitting: Family Medicine

## 2016-08-15 VITALS — BP 160/110 | HR 80 | Temp 98.4°F | Resp 16 | Ht 72.0 in | Wt 252.0 lb

## 2016-08-15 DIAGNOSIS — E291 Testicular hypofunction: Secondary | ICD-10-CM | POA: Diagnosis not present

## 2016-08-15 MED ORDER — AMLODIPINE BESYLATE 10 MG PO TABS: 10.0000 mg | ORAL_TABLET | Freq: Every day | ORAL | 3 refills | Status: DC

## 2016-08-15 NOTE — Progress Notes (Signed)
Subjective:    Patient ID: Miguel Mathis, male    DOB: 14-Aug-1970, 46 y.o.   MRN: [id number]  Hypertension     10/2015 Patient is a 46 year old white male here today for physical exam.  His wife recently had a baby. Unfortunately the baby is in the NICU with a pneumothorax, hypoglycemia, and infection. I will see the patient is very concerned about this. Since last time I saw the patient, he has gained 34 pounds. He is started a job that is very sedentary. He is not exercising. His blood pressure has increased. His LDL closer has increased as well is evidence to his lab work below. His HDL cholesterol has dropped although it is still excellent. He does complain of daily headaches. The patient was sitting in a chair approximately 4 weeks ago when the chair suddenly broke for no reason causing the patient to fall to the ground and strike his head on a table. The injury occurred to his occiput. He is still tender to palpation in that area. He was momentarily stunned although he denies any loss of consciousness. Since that time he has had headaches. The headaches are worse with strenuous activity and concentration and stress. They're pulsatile in nature. They improve with rest. They sound almost migraine in nature like the patient may have suffered a postconcussive headache/disorder. He also complains of fluid in his inner ear. On examination today the ear canals are clear. He has a small middle ear effusion.  At that time, my plan was: Physical exam is significant for weight gain of 34 pounds, borderline blood pressure, and elevated LDL cholesterol. I have recommended 30 minutes a day 5 days a week of aerobic exercise, 15-20 pounds weight loss, and a low sodium diet to address. I will recheck labs in 6 months. The remainder of his lab work is normal. I believe he suffered a concussion when he fell and now has a postconcussive headache. I recommended tincture of time and rest. Should the headache intensify, I  would proceed with a CT scan although based on his normal neurologic exam I believe the risk of an intercranial hemorrhage is virtually 0 given the length of time since the injury. I recommended Flonase and Zyrtec for middle ear effusion. If no better consider an ENT referral for tympanostomy tube placement  04/04/16 Went to another doctor and ws started on bystolic 5 mg poqday.  Has failed lisinopril due to cough in past and valsartan caused angioedema.  On bystolic, reports ED, fatigue, and BP is 140's/90's.  At that time, my plan was: DC bystolic.  Start hctz 25 mg poqday and recheck bp in 3-4 weeks.  Check cbc, cmp, testosterone.    No visits with results within 3 Month(s) from this visit.  Latest known visit with results is:  Office Visit on 05/06/2016  Component Date Value Ref Range Status  . Testosterone 05/06/2016 365  250 - 827 ng/dL Final  . Albumin 05/06/2016 5.0  3.6 - 5.1 g/dL Final  . Sex Hormone Binding 05/06/2016 33  10 - 50 nmol/L Final  . Testosterone, Free 05/06/2016 45.8* 46.0 - 224.0 pg/mL Final  . Testosterone, Bioavailable 05/06/2016 104.1* 110.0 - 575.0 ng/dL Final   05/06/16 Here for follow up.  His blood pressure today is borderline on hydrochlorothiazide but is much better. He also feels better than he did on bystolic however he continues to have erectile problems, fatigue, poor libido. Testosterone levels were extremely low. He is interested in testosterone replacement.  He also reports occasional panic attacks and daily anxiety.  Bystolic seem to help this when he took it however he did not like the way the medicine made him feel. 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 He is interested in possible options to treat anxiety.  At that time, my plan was: Blood pressure is borderline. I think with increased exercise and weight loss his blood pressure will be at goal. I'm hoping that by replacing his testosterone with testosterone cypionate 200 mg IM every 2 weeks,  patient will have more energy and drive and can start exercising more which managed to help his blood pressure. I will repeat a testosterone level today to verify and then I will start him on testosterone replacement. We discussed the risk of cardiovascular disease, prostate cancer, and low sperm counts. He can consider starting something like Lexapro for anxiety but I would first like to start testosterone and see how he feels on the medication before making any further changes.  08/15/16  Patient feels much better on testosterone.  Energy level has improved dramatically.  Denies any rage, dysuria, hesitancy, headaches, chest pain, sob, or doe, or pleurisy.  However his blood pressure today remains elevated even on hydrochlorothiazide despite the fact he is back in the gym. In the past he has tried lisinopril and experienced a cough. He tried valsartan and experienced angioedema. He tried by systolic and experienced fatigue and it did not work very well.  Past Medical History:  Diagnosis Date  . Basal cell cancer    Past Surgical History:  Procedure Laterality Date  . KNEE ARTHROSCOPY     Current Outpatient Prescriptions on File Prior to Visit  Medication Sig Dispense Refill  . fluticasone (FLONASE) 50 MCG/ACT nasal spray Place 2 sprays into both nostrils daily. 16 g 6  . hydrochlorothiazide (HYDRODIURIL) 25 MG tablet Take 1 tablet (25 mg total) by mouth daily. 90 tablet 3  . testosterone cypionate (DEPOTESTOSTERONE CYPIONATE) 200 MG/ML injection Inject 1 mL (200 mg total) into the muscle every 14 (fourteen) days. 10 mL 2   No current facility-administered medications on file prior to visit.    Allergies  Allergen Reactions  . Diovan [Valsartan] Swelling   Social History   Social History  . Marital status: Married    Spouse name: N/A  . Number of children: N/A  . Years of education: N/A   Occupational History  . Not on file.   Social History Main Topics  . Smoking status: Never  Smoker  . Smokeless tobacco: Current User    Types: Snuff  . Alcohol use 3.6 oz/week    6 Cans of beer per week  . Drug use: No  . 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 Not on file   Social History Narrative  . No narrative on file   Family History  Problem Relation Age of Onset  . Cancer Paternal Aunt        breast  . Cancer Paternal Uncle        lung  . Cancer Paternal Uncle        brain  . Cancer Paternal Uncle        unknown to pt      Review of Systems  All other systems reviewed and are negative.      Objective:   Physical Exam  Constitutional: He is oriented to person, place, and time. He  appears well-developed and well-nourished. No distress.  HENT:  Head: Normocephalic and atraumatic.  Right Ear: External ear normal.  Left Ear: External ear normal.  Nose: Nose normal.  Mouth/Throat: Oropharynx is clear and moist. No oropharyngeal exudate.  Eyes: Conjunctivae and EOM are normal. Pupils are equal, round, and reactive to light. Right eye exhibits no discharge. Left eye exhibits no discharge. No scleral icterus.  Neck: Normal range of motion. Neck supple. No JVD present. No tracheal deviation present. No thyromegaly present.  Cardiovascular: Normal rate, regular rhythm, normal heart sounds and intact distal pulses.  Exam reveals no gallop and no friction rub.   No murmur heard. Pulmonary/Chest: Effort normal and breath sounds normal. No stridor. No respiratory distress. He has no wheezes. He has no rales. He exhibits no tenderness.  Abdominal: Soft. Bowel sounds are normal. He exhibits no distension and no mass. There is no tenderness. There is no rebound and no guarding.  Genitourinary: Penis normal. Rectal exam shows guaiac negative stool. No penile tenderness.  Musculoskeletal: Normal range of motion. He exhibits no edema or tenderness.  Lymphadenopathy:    He has no cervical adenopathy.  Neurological:  He is alert and oriented to person, place, and time. He has normal reflexes. No cranial nerve deficit. He exhibits normal muscle tone. Coordination normal.  Skin: Skin is warm. No rash noted. He is not diaphoretic. No erythema. No pallor.  Psychiatric: He has a normal mood and affect. His behavior is normal. Judgment and thought content normal.  Vitals reviewed.         Assessment & Plan:  Hypogonadism in male - Plan: CBC with Differential/Platelet, PSA, COMPLETE METABOLIC PANEL WITH GFR  Symptomatically, the patient has improved. Check a testosterone level to ensure that the levels are.supratherapeutic. Check PSA as well as a CBC to evaluate for polycythemia. If levels are normal, I will make no change. Regarding his blood pressure I will add amlodipine 10 mg a day and recheck blood pressure in one month.

## 2016-08-16 LAB — CBC WITH DIFFERENTIAL/PLATELET
BASOS ABS: 0 {cells}/uL (ref 0–200)
BASOS PCT: 0 %
EOS ABS: 0 {cells}/uL — AB (ref 15–500)
EOS PCT: 0 %
HCT: 51.3 % — ABNORMAL HIGH (ref 38.5–50.0)
Hemoglobin: 17.9 g/dL — ABNORMAL HIGH (ref 13.0–17.0)
LYMPHS PCT: 21 %
Lymphs Abs: 1407 cells/uL (ref 850–3900)
MCH: 32.8 pg (ref 27.0–33.0)
MCHC: 34.9 g/dL (ref 32.0–36.0)
MCV: 94.1 fL (ref 80.0–100.0)
MONOS PCT: 18 %
MPV: 10.5 fL (ref 7.5–12.5)
Monocytes Absolute: 1206 cells/uL — ABNORMAL HIGH (ref 200–950)
Neutro Abs: 4087 cells/uL (ref 1500–7800)
Neutrophils Relative %: 61 %
PLATELETS: 230 10*3/uL (ref 140–400)
RBC: 5.45 MIL/uL (ref 4.20–5.80)
RDW: 14.1 % (ref 11.0–15.0)
WBC: 6.7 10*3/uL (ref 3.8–10.8)

## 2016-08-16 LAB — COMPLETE METABOLIC PANEL WITH GFR
ALT: 44 U/L (ref 9–46)
AST: 24 U/L (ref 10–40)
Albumin: 4.6 g/dL (ref 3.6–5.1)
Alkaline Phosphatase: 64 U/L (ref 40–115)
BUN: 17 mg/dL (ref 7–25)
CALCIUM: 10 mg/dL (ref 8.6–10.3)
CHLORIDE: 98 mmol/L (ref 98–110)
CO2: 27 mmol/L (ref 20–31)
Creat: 1.23 mg/dL (ref 0.60–1.35)
GFR, EST AFRICAN AMERICAN: 81 mL/min (ref >=60)
GFR, Est Non African American: 70 mL/min (ref >=60)
Glucose, Bld: 88 mg/dL (ref 70–99)
POTASSIUM: 3.7 mmol/L (ref 3.5–5.3)
Sodium: 137 mmol/L (ref 135–146)
Total Bilirubin: 0.6 mg/dL (ref 0.2–1.2)
Total Protein: 7.6 g/dL (ref 6.1–8.1)

## 2016-08-16 LAB — TESTOSTERONE: Testosterone: 538 ng/dL (ref 250–827)

## 2016-08-16 LAB — PSA: PSA: 0.4 ng/mL (ref <=4.0)

## 2016-09-16 ENCOUNTER — Encounter: Payer: Self-pay | Admitting: Family Medicine

## 2016-09-16 ENCOUNTER — Ambulatory Visit (INDEPENDENT_AMBULATORY_CARE_PROVIDER_SITE_OTHER): Payer: BLUE CROSS/BLUE SHIELD | Admitting: Family Medicine

## 2016-09-16 VITALS — BP 142/100 | HR 78 | Temp 98.4°F | Resp 16 | Ht 72.0 in | Wt 250.0 lb

## 2016-09-16 DIAGNOSIS — I10 Essential (primary) hypertension: Secondary | ICD-10-CM

## 2016-09-16 NOTE — Progress Notes (Signed)
Subjective:    Patient ID: Miguel Mathis, male    DOB: 20-Oct-1970, 46 y.o.   MRN: [id number]  Hypertension     10/2015 Patient is a 46 year old white male here today for physical exam.  His wife recently had a baby. Unfortunately the baby is in the NICU with a pneumothorax, hypoglycemia, and infection. I will see the patient is very concerned about this. Since last time I saw the patient, he has gained 34 pounds. He is started a job that is very sedentary. He is not exercising. His blood pressure has increased. His LDL closer has increased as well is evidence to his lab work below. His HDL cholesterol has dropped although it is still excellent. He does complain of daily headaches. The patient was sitting in a chair approximately 4 weeks ago when the chair suddenly broke for no reason causing the patient to fall to the ground and strike his head on a table. The injury occurred to his occiput. He is still tender to palpation in that area. He was momentarily stunned although he denies any loss of consciousness. Since that time he has had headaches. The headaches are worse with strenuous activity and concentration and stress. They're pulsatile in nature. They improve with rest. They sound almost migraine in nature like the patient may have suffered a postconcussive headache/disorder. He also complains of fluid in his inner ear. On examination today the ear canals are clear. He has a small middle ear effusion.  At that time, my plan was: Physical exam is significant for weight gain of 34 pounds, borderline blood pressure, and elevated LDL cholesterol. I have recommended 30 minutes a day 5 days a week of aerobic exercise, 15-20 pounds weight loss, and a low sodium diet to address. I will recheck labs in 6 months. The remainder of his lab work is normal. I believe he suffered a concussion when he fell and now has a postconcussive headache. I recommended tincture of time and rest. Should the headache intensify, I  would proceed with a CT scan although based on his normal neurologic exam I believe the risk of an intercranial hemorrhage is virtually 0 given the length of time since the injury. I recommended Flonase and Zyrtec for middle ear effusion. If no better consider an ENT referral for tympanostomy tube placement  04/04/16 Went to another doctor and ws started on bystolic 5 mg poqday.  Has failed lisinopril due to cough in past and valsartan caused angioedema.  On bystolic, reports ED, fatigue, and BP is 140's/90's.  At that time, my plan was: DC bystolic.  Start hctz 25 mg poqday and recheck bp in 3-4 weeks.  Check cbc, cmp, testosterone.     05/06/16 Here for follow up.  His blood pressure today is borderline on hydrochlorothiazide but is much better. He also feels better than he did on bystolic however he continues to have erectile problems, fatigue, poor libido. Testosterone levels were extremely low. He is interested in testosterone replacement. He also reports occasional panic attacks and daily anxiety.  Bystolic seem to help this when he took it however he did not like the way the medicine made him feel. 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 He is interested in possible options to treat anxiety.  At that time, my plan was: Blood pressure is borderline. I think with increased exercise and weight loss his blood pressure will be at goal. I'm hoping that by replacing his testosterone with testosterone cypionate 200 mg IM  every 2 weeks, patient will have more energy and drive and can start exercising more which managed to help his blood pressure. I will repeat a testosterone level today to verify and then I will start him on testosterone replacement. We discussed the risk of cardiovascular disease, prostate cancer, and low sperm counts. He can consider starting something like Lexapro for anxiety but I would first like to start testosterone and see how he feels on the medication before making any  further changes.  08/15/16  Patient feels much better on testosterone.  Energy level has improved dramatically.  Denies any rage, dysuria, hesitancy, headaches, chest pain, sob, or doe, or pleurisy.  However his blood pressure today remains elevated even on hydrochlorothiazide despite the fact he is back in the gym. In the past he has tried lisinopril and experienced a cough. He tried valsartan and experienced angioedema. He tried by systolic and experienced fatigue and it did not work very well.  At that time, my plan was: Symptomatically, the patient has improved. Check a testosterone level to ensure that the levels are.supratherapeutic. Check PSA as well as a CBC to evaluate for polycythemia. If levels are normal, I will make no change. Regarding his blood pressure I will add amlodipine 10 mg a day and recheck blood pressure in one month.  Most recent labs below: No visits with results within 1 Month(s) from this visit.  Latest known visit with results is:  Office Visit on 08/15/2016  Component Date Value Ref Range Status  . WBC 08/15/2016 6.7  3.8 - 10.8 K/uL Final  . RBC 08/15/2016 5.45  4.20 - 5.80 MIL/uL Final  . Hemoglobin 08/15/2016 17.9* 13.0 - 17.0 g/dL Final  . HCT 08/15/2016 51.3* 38.5 - 50.0 % Final  . MCV 08/15/2016 94.1  80.0 - 100.0 fL Final  . MCH 08/15/2016 32.8  27.0 - 33.0 pg Final  . MCHC 08/15/2016 34.9  32.0 - 36.0 g/dL Final  . RDW 08/15/2016 14.1  11.0 - 15.0 % Final  . Platelets 08/15/2016 230  140 - 400 K/uL Final  . MPV 08/15/2016 10.5  7.5 - 12.5 fL Final  . Neutro Abs 08/15/2016 4087  1,500 - 7,800 cells/uL Final  . Lymphs Abs 08/15/2016 1407  850 - 3,900 cells/uL Final  . Monocytes Absolute 08/15/2016 1206* 200 - 950 cells/uL Final  . Eosinophils Absolute 08/15/2016 0* 15 - 500 cells/uL Final  . Basophils Absolute 08/15/2016 0  0 - 200 cells/uL Final  . Neutrophils Relative % 08/15/2016 61  % Final  . Lymphocytes Relative 08/15/2016 21  % Final  . Monocytes  Relative 08/15/2016 18  % Final  . Eosinophils Relative 08/15/2016 0  % Final  . Basophils Relative 08/15/2016 0  % Final  . Smear Review 08/15/2016 Criteria for review not met   Final  . PSA 08/15/2016 0.4  <=4.0 ng/mL Final   Comment:   The total PSA value from this assay system is standardized against the WHO standard. The test result will be approximately 20% lower when compared to the equimolar-standardized total PSA (Beck[employer] Coulter). Comparison of serial PSA results should be interpreted with this fact in mind.   This test was performed using the Siemens chemiluminescent method. Values obtained from different assay methods cannot be used interchangeably. PSA levels, regardless of value, should not be interpreted as absolute evidence of the presence or absence of disease.     . Sodium 08/15/2016 137  135 - 146 mmol/L Final  . Potassium  08/15/2016 3.7  3.5 - 5.3 mmol/L Final  . Chloride 08/15/2016 98  98 - 110 mmol/L Final  . CO2 08/15/2016 27  20 - 31 mmol/L Final  . Glucose, Bld 08/15/2016 88  70 - 99 mg/dL Final  . BUN 08/15/2016 17  7 - 25 mg/dL Final  . Creat 08/15/2016 1.23  0.60 - 1.35 mg/dL Final  . Total Bilirubin 08/15/2016 0.6  0.2 - 1.2 mg/dL Final  . Alkaline Phosphatase 08/15/2016 64  40 - 115 U/L Final  . AST 08/15/2016 24  10 - 40 U/L Final  . ALT 08/15/2016 44  9 - 46 U/L Final  . Total Protein 08/15/2016 7.6  6.1 - 8.1 g/dL Final  . Albumin 08/15/2016 4.6  3.6 - 5.1 g/dL Final  . Calcium 08/15/2016 10.0  8.6 - 10.3 mg/dL Final  . GFR, Est African American 08/15/2016 81  >=60 mL/min Final  . GFR, Est Non African American 08/15/2016 70  >=60 mL/min Final  . Testosterone 08/15/2016 538  250 - 827 ng/dL Final    09/16/16 Based on his elevated hemoglobin, I did recommend decreasing his dose of testosterone to avoid worsening polycythemia. He is here today however mainly to recheck his blood pressure since the addition of amlodipine.  Since starting amlodipine,  his blood pressure has improved but he has significant edema in his legs that he cannot tolerate Past Medical History:  Diagnosis Date  . Basal cell cancer    Past Surgical History:  Procedure Laterality Date  . KNEE ARTHROSCOPY     Current Outpatient Prescriptions on File Prior to Visit  Medication Sig Dispense Refill  . amLODipine (NORVASC) 10 MG tablet Take 1 tablet (10 mg total) by mouth daily. 90 tablet 3  . fluticasone (FLONASE) 50 MCG/ACT nasal spray Place 2 sprays into both nostrils daily. 16 g 6  . hydrochlorothiazide (HYDRODIURIL) 25 MG tablet Take 1 tablet (25 mg total) by mouth daily. 90 tablet 3  . testosterone cypionate (DEPOTESTOSTERONE CYPIONATE) 200 MG/ML injection Inject 1 mL (200 mg total) into the muscle every 14 (fourteen) days. 10 mL 2   No current facility-administered medications on file prior to visit.    Allergies  Allergen Reactions  . Diovan [Valsartan] Swelling   Social History   Social History  . Marital status: Married    Spouse name: N/A  . Number of children: N/A  . Years of education: N/A   Occupational History  . Not on file.   Social History Main Topics  . Smoking status: Never Smoker  . Smokeless tobacco: Current User    Types: Snuff  . Alcohol use 3.6 oz/week    6 Cans of beer per week  . Drug use: No  . 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 Not on file   Social History Narrative  . No narrative on file   Family History  Problem Relation Age of Onset  . Cancer Paternal Aunt        breast  . Cancer Paternal Uncle        lung  . Cancer Paternal Uncle        brain  . Cancer Paternal Uncle        unknown to pt      Review of Systems  All other systems reviewed and are negative.      Objective:   Physical Exam  Constitutional: He is oriented to  person, place, and time. He appears well-developed and well-nourished. No distress.  HENT:  Head: Normocephalic and  atraumatic.  Right Ear: External ear normal.  Left Ear: External ear normal.  Nose: Nose normal.  Mouth/Throat: Oropharynx is clear and moist. No oropharyngeal exudate.  Eyes: Pupils are equal, round, and reactive to light. Conjunctivae and EOM are normal. Right eye exhibits no discharge. Left eye exhibits no discharge. No scleral icterus.  Neck: Normal range of motion. Neck supple. No JVD present. No tracheal deviation present. No thyromegaly present.  Cardiovascular: Normal rate, regular rhythm, normal heart sounds and intact distal pulses.  Exam reveals no gallop and no friction rub.   No murmur heard. Pulmonary/Chest: Effort normal and breath sounds normal. No stridor. No respiratory distress. He has no wheezes. He has no rales. He exhibits no tenderness.  Abdominal: Soft. Bowel sounds are normal. He exhibits no distension and no mass. There is no tenderness. There is no rebound and no guarding.  Genitourinary: Penis normal. Rectal exam shows guaiac negative stool. No penile tenderness.  Musculoskeletal: Normal range of motion. He exhibits no edema or tenderness.  Lymphadenopathy:    He has no cervical adenopathy.  Neurological: He is alert and oriented to person, place, and time. He has normal reflexes. No cranial nerve deficit. He exhibits normal muscle tone. Coordination normal.  Skin: Skin is warm. No rash noted. He is not diaphoretic. No erythema. No pallor.  Psychiatric: He has a normal mood and affect. His behavior is normal. Judgment and thought content normal.  Vitals reviewed.         Assessment & Plan:   Ess htn Discontinue amlodipine and replaced bystolic 5 mg a day and recheck blood pressure in one month. If he cannot tolerate this medication, I would next try cardura.  Cannot take ACE inhibitor or angiotensin receptor blockers due to a history of angioedema

## 2016-10-14 ENCOUNTER — Telehealth: Payer: Self-pay | Admitting: Family Medicine

## 2016-10-14 MED ORDER — NEBIVOLOL HCL 5 MG PO TABS: 5.0000 mg | ORAL_TABLET | Freq: Every day | ORAL | 5 refills | Status: DC

## 2016-10-14 NOTE — Telephone Encounter (Signed)
New Message  Pt c/o medication issue:  1. Name of Medication: Bystolic 5 mg once daily  2. How are you currently taking this medication (dosage and times per day)? See above  3. Are you having a reaction (difficulty breathing--STAT)? N/A  4. What is your medication issue? Pt voiced needing refills on this medication sample but it is not listed in his chart.  [facility] [address] [place], [place] is the pharmacy

## 2016-10-14 NOTE — Telephone Encounter (Signed)
Per LOV Bystolic 5 mg - med sent to [facility]

## 2017-01-11 ENCOUNTER — Other Ambulatory Visit: Payer: Self-pay | Admitting: Family Medicine

## 2017-01-11 MED ORDER — NEBIVOLOL HCL 5 MG PO TABS: 5.0000 mg | ORAL_TABLET | Freq: Every day | ORAL | 3 refills | Status: DC

## 2017-01-28 ENCOUNTER — Other Ambulatory Visit: Payer: Self-pay | Admitting: Family Medicine

## 2017-01-28 DIAGNOSIS — E291 Testicular hypofunction: Secondary | ICD-10-CM

## 2017-01-30 NOTE — Telephone Encounter (Signed)
Okay to refill? 

## 2017-01-30 NOTE — Telephone Encounter (Signed)
Ok to refill??  Last office visit 09/16/2016.  Last refill 05/06/2016, #2 refills.   Of note, last lab 08/15/2016 and noted WNL.

## 2017-01-30 NOTE — Telephone Encounter (Signed)
Medication called to pharmacy. 

## 2017-03-16 ENCOUNTER — Other Ambulatory Visit: Payer: Self-pay | Admitting: Family Medicine

## 2017-05-03 ENCOUNTER — Other Ambulatory Visit: Payer: Self-pay | Admitting: Family Medicine

## 2017-05-03 DIAGNOSIS — E291 Testicular hypofunction: Secondary | ICD-10-CM

## 2017-05-03 NOTE — Telephone Encounter (Signed)
LOV - 09/16/16   LRF - 01/30/17  Please send to pharm

## 2017-05-04 ENCOUNTER — Encounter: Payer: Self-pay | Admitting: Family Medicine

## 2017-05-04 NOTE — Telephone Encounter (Signed)
Is due for lab work and ov (q 6 months)

## 2017-05-04 NOTE — Telephone Encounter (Signed)
Letter sent to pt to make apt.

## 2017-06-16 ENCOUNTER — Ambulatory Visit: Payer: BLUE CROSS/BLUE SHIELD | Admitting: Family Medicine

## 2017-07-18 ENCOUNTER — Other Ambulatory Visit: Payer: Self-pay | Admitting: Family Medicine

## 2017-07-18 DIAGNOSIS — D582 Other hemoglobinopathies: Secondary | ICD-10-CM

## 2017-07-18 DIAGNOSIS — R7989 Other specified abnormal findings of blood chemistry: Secondary | ICD-10-CM

## 2017-07-18 DIAGNOSIS — Z79890 Hormone replacement therapy (postmenopausal): Secondary | ICD-10-CM

## 2017-07-21 ENCOUNTER — Encounter: Payer: Self-pay | Admitting: Family Medicine

## 2017-07-21 ENCOUNTER — Ambulatory Visit: Payer: BLUE CROSS/BLUE SHIELD | Admitting: Family Medicine

## 2017-07-21 VITALS — BP 122/90 | HR 84 | Temp 98.4°F | Resp 14 | Ht 72.0 in | Wt 223.0 lb

## 2017-07-21 DIAGNOSIS — I10 Essential (primary) hypertension: Secondary | ICD-10-CM | POA: Diagnosis not present

## 2017-07-21 DIAGNOSIS — E291 Testicular hypofunction: Secondary | ICD-10-CM

## 2017-07-21 DIAGNOSIS — M7712 Lateral epicondylitis, left elbow: Secondary | ICD-10-CM

## 2017-07-21 NOTE — Progress Notes (Signed)
Subjective:    Patient ID: ESTEFAN PATTISON, male    DOB: 10/09/1970, 47 y.o.   MRN: [id number]  Hypertension     10/2015 Patient is a 47 year old white male here today for physical exam.  His wife recently had a baby. Unfortunately the baby is in the NICU with a pneumothorax, hypoglycemia, and infection. I will see the patient is very concerned about this. Since last time I saw the patient, he has gained 34 pounds. He is started a job that is very sedentary. He is not exercising. His blood pressure has increased. His LDL closer has increased as well is evidence to his lab work below. His HDL cholesterol has dropped although it is still excellent. He does complain of daily headaches. The patient was sitting in a chair approximately 4 weeks ago when the chair suddenly broke for no reason causing the patient to fall to the ground and strike his head on a table. The injury occurred to his occiput. He is still tender to palpation in that area. He was momentarily stunned although he denies any loss of consciousness. Since that time he has had headaches. The headaches are worse with strenuous activity and concentration and stress. They're pulsatile in nature. They improve with rest. They sound almost migraine in nature like the patient may have suffered a postconcussive headache/disorder. He also complains of fluid in his inner ear. On examination today the ear canals are clear. He has a small middle ear effusion.  At that time, my plan was: Physical exam is significant for weight gain of 34 pounds, borderline blood pressure, and elevated LDL cholesterol. I have recommended 30 minutes a day 5 days a week of aerobic exercise, 15-20 pounds weight loss, and a low sodium diet to address. I will recheck labs in 6 months. The remainder of his lab work is normal. I believe he suffered a concussion when he fell and now has a postconcussive headache. I recommended tincture of time and rest. Should the headache intensify, I  would proceed with a CT scan although based on his normal neurologic exam I believe the risk of an intercranial hemorrhage is virtually 0 given the length of time since the injury. I recommended Flonase and Zyrtec for middle ear effusion. If no better consider an ENT referral for tympanostomy tube placement  04/04/16 Went to another doctor and ws started on bystolic 5 mg poqday.  Has failed lisinopril due to cough in past and valsartan caused angioedema.  On bystolic, reports ED, fatigue, and BP is 140's/90's.  At that time, my plan was: DC bystolic.  Start hctz 25 mg poqday and recheck bp in 3-4 weeks.  Check cbc, cmp, testosterone.     05/06/16 Here for follow up.  His blood pressure today is borderline on hydrochlorothiazide but is much better. He also feels better than he did on bystolic however he continues to have erectile problems, fatigue, poor libido. Testosterone levels were extremely low. He is interested in testosterone replacement. He also reports occasional panic attacks and daily anxiety.  Bystolic seem to help this when he took it however he did not like the way the medicine made him feel. 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 He is interested in possible options to treat anxiety.  At that time, my plan was: Blood pressure is borderline. I think with increased exercise and weight loss his blood pressure will be at goal. I'm hoping that by replacing his testosterone with testosterone cypionate 200 mg IM  every 2 weeks, patient will have more energy and drive and can start exercising more which managed to help his blood pressure. I will repeat a testosterone level today to verify and then I will start him on testosterone replacement. We discussed the risk of cardiovascular disease, prostate cancer, and low sperm counts. He can consider starting something like Lexapro for anxiety but I would first like to start testosterone and see how he feels on the medication before making any  further changes.  08/15/16  Patient feels much better on testosterone.  Energy level has improved dramatically.  Denies any rage, dysuria, hesitancy, headaches, chest pain, sob, or doe, or pleurisy.  However his blood pressure today remains elevated even on hydrochlorothiazide despite the fact he is back in the gym. In the past he has tried lisinopril and experienced a cough. He tried valsartan and experienced angioedema. He tried by systolic and experienced fatigue and it did not work very well.  At that time, my plan was: Symptomatically, the patient has improved. Check a testosterone level to ensure that the levels are.supratherapeutic. Check PSA as well as a CBC to evaluate for polycythemia. If levels are normal, I will make no change. Regarding his blood pressure I will add amlodipine 10 mg a day and recheck blood pressure in one month.  Most recent labs below: No visits with results within 1 Month(s) from this visit.  Latest known visit with results is:  Office Visit on 08/15/2016  Component Date Value Ref Range Status  . WBC 08/15/2016 6.7  3.8 - 10.8 K/uL Final  . RBC 08/15/2016 5.45  4.20 - 5.80 MIL/uL Final  . Hemoglobin 08/15/2016 17.9* 13.0 - 17.0 g/dL Final  . HCT 08/15/2016 51.3* 38.5 - 50.0 % Final  . MCV 08/15/2016 94.1  80.0 - 100.0 fL Final  . MCH 08/15/2016 32.8  27.0 - 33.0 pg Final  . MCHC 08/15/2016 34.9  32.0 - 36.0 g/dL Final  . RDW 08/15/2016 14.1  11.0 - 15.0 % Final  . Platelets 08/15/2016 230  140 - 400 K/uL Final  . MPV 08/15/2016 10.5  7.5 - 12.5 fL Final  . Neutro Abs 08/15/2016 4,087  1,500 - 7,800 cells/uL Final  . Lymphs Abs 08/15/2016 1,407  850 - 3,900 cells/uL Final  . Monocytes Absolute 08/15/2016 1,206* 200 - 950 cells/uL Final  . Eosinophils Absolute 08/15/2016 0* 15 - 500 cells/uL Final  . Basophils Absolute 08/15/2016 0  0 - 200 cells/uL Final  . Neutrophils Relative % 08/15/2016 61  % Final  . Lymphocytes Relative 08/15/2016 21  % Final  .  Monocytes Relative 08/15/2016 18  % Final  . Eosinophils Relative 08/15/2016 0  % Final  . Basophils Relative 08/15/2016 0  % Final  . Smear Review 08/15/2016 Criteria for review not met   Final  . PSA 08/15/2016 0.4  <=4.0 ng/mL Final   Comment:   The total PSA value from this assay system is standardized against the WHO standard. The test result will be approximately 20% lower when compared to the equimolar-standardized total PSA (Beckman Coulter). Comparison of serial PSA results should be interpreted with this fact in mind.   This test was performed using the Siemens chemiluminescent method. Values obtained from different assay methods cannot be used interchangeably. PSA levels, regardless of value, should not be interpreted as absolute evidence of the presence or absence of disease.     . Sodium 08/15/2016 137  135 - 146 mmol/L Final  . Potassium  08/15/2016 3.7  3.5 - 5.3 mmol/L Final  . Chloride 08/15/2016 98  98 - 110 mmol/L Final  . CO2 08/15/2016 27  20 - 31 mmol/L Final  . Glucose, Bld 08/15/2016 88  70 - 99 mg/dL Final  . BUN 08/15/2016 17  7 - 25 mg/dL Final  . Creat 08/15/2016 1.23  0.60 - 1.35 mg/dL Final  . Total Bilirubin 08/15/2016 0.6  0.2 - 1.2 mg/dL Final  . Alkaline Phosphatase 08/15/2016 64  40 - 115 U/L Final  . AST 08/15/2016 24  10 - 40 U/L Final  . ALT 08/15/2016 44  9 - 46 U/L Final  . Total Protein 08/15/2016 7.6  6.1 - 8.1 g/dL Final  . Albumin 08/15/2016 4.6  3.6 - 5.1 g/dL Final  . Calcium 08/15/2016 10.0  8.6 - 10.3 mg/dL Final  . GFR, Est African American 08/15/2016 81  >=60 mL/min Final  . GFR, Est Non African American 08/15/2016 70  >=60 mL/min Final  . Testosterone 08/15/2016 538  250 - 827 ng/dL Final    09/16/16 Based on his elevated hemoglobin, I did recommend decreasing his dose of testosterone to avoid worsening polycythemia. He is here today however mainly to recheck his blood pressure since the addition of amlodipine.  Since starting  amlodipine, his blood pressure has improved but he has significant edema in his legs that he cannot tolerate.  At that time, my plan IOE:[id number] amlodipine and replaced bystolic 5 mg a day and recheck blood pressure in one month. If he cannot tolerate this medication, I would next try cardura.  Cannot take ACE inhibitor or angiotensin receptor blockers due to a history of angioedema.  07/21/17 Since I last saw the patient, he has successfully lost substantial weight.  He is eating more salads and limiting his intake of carbohydrates.  He is also exercising regularly at the gym.  As a result his blood pressure is much better.  He denies any chest pain shortness of breath or dyspnea on exertion.  He is still using testosterone but he denies any fatigue, decreased libido, erectile dysfunction.  He is overdue to check a PSA, CBC, and a testosterone level.  He is also due for fasting lab work to monitor his cholesterol.  Several weeks ago, he was lifting a heavy dolly at work when he felt a pop in his left elbow near the lateral epicondyle.  He is now exquisitely tender to palpation over the lateral epicondyle.  Resisted gripping and resisted wrist dorsiflexion cause extreme pain in that area.  He is concerned that he may have torn the tendon rather than have lateral epicondylitis.  His strength is still very good however he is a muscular patient and therefore his strength may be masking a small tear.  Past Medical History:  Diagnosis Date  . Basal cell cancer    Past Surgical History:  Procedure Laterality Date  . KNEE ARTHROSCOPY     Current Outpatient Medications on File Prior to Visit  Medication Sig Dispense Refill  . fluticasone (FLONASE) 50 MCG/ACT nasal spray Place 2 sprays into both nostrils daily. 16 g 6  . hydrochlorothiazide (HYDRODIURIL) 25 MG tablet TAKE 1 TABLET (25 MG TOTAL) BY MOUTH DAILY. 90 tablet 3  . nebivolol (BYSTOLIC) 5 MG tablet Take 1 tablet (5 mg total) daily by mouth. 90  tablet 3  . testosterone cypionate (DEPOTESTOSTERONE CYPIONATE) 200 MG/ML injection INJECT 1ML INTO THE MUSCLE EVERY 14 DAYS 10 mL 0   No current facility-administered  medications on file prior to visit.     Allergies  Allergen Reactions  . Diovan [Valsartan] Swelling   Social History   Socioeconomic History  . Marital status: Married    Spouse name: Not on file  . Number of children: Not on file  . Years of education: Not on file  . Highest education level: Not on file  Occupational History  . Not on file  Social Needs  . Financial resource strain: Not on file  . Food insecurity:    Worry: Not on file    Inability: Not on file  . Transportation needs:    Medical: Not on file    Non-medical: Not on file  Tobacco Use  . Smoking status: Never Smoker  . Smokeless tobacco: Current User    Types: Snuff  Substance and Sexual Activity  . Alcohol use: Yes    Alcohol/week: 3.6 oz    Types: 6 Cans of beer per week  . Drug use: No  . 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 Physical activity:    Days per week: Not on file    Minutes per session: Not on file  . Stress: Not on file  Relationships  . Social connections:    Talks on phone: Not on file    Gets together: Not on file    Attends religious service: Not on file    Active member of club or organization: Not on file    Attends meetings of clubs or organizations: Not on file    Relationship status: Not on file  . Intimate partner violence:    Fear of current or ex partner: Not on file    Emotionally abused: Not on file    Physically abused: Not on file    Forced sexual activity: Not on file  Other Topics Concern  . Not on file  Social History Narrative  . Not on file   Family History  Problem Relation Age of Onset  . Cancer Paternal Aunt        breast  . Cancer Paternal Uncle        lung  . Cancer Paternal Uncle        brain  . Cancer Paternal Uncle        unknown to  pt      Review of Systems  All other systems reviewed and are negative.      Objective:   Physical Exam  Constitutional: He is oriented to person, place, and time. He appears well-developed and well-nourished. No distress.  HENT:  Head: Normocephalic and atraumatic.  Right Ear: External ear normal.  Left Ear: External ear normal.  Nose: Nose normal.  Mouth/Throat: Oropharynx is clear and moist. No oropharyngeal exudate.  Eyes: Pupils are equal, round, and reactive to light. Conjunctivae and EOM are normal. Right eye exhibits no discharge. Left eye exhibits no discharge. No scleral icterus.  Neck: Normal range of motion. Neck supple. No JVD present. No tracheal deviation present. No thyromegaly present.  Cardiovascular: Normal rate, regular rhythm, normal heart sounds and intact distal pulses. Exam reveals no gallop and no friction rub.  No murmur heard. Pulmonary/Chest: Effort normal and breath sounds normal. No stridor. No respiratory distress. He has no wheezes. He has no rales. He exhibits no tenderness.  Abdominal: Soft. Bowel sounds are normal. He exhibits no distension and no mass. There is no tenderness. There is no rebound and no guarding.  Musculoskeletal: Normal range of motion. He exhibits no edema or tenderness.  Lymphadenopathy:    He has no cervical adenopathy.  Neurological: He is alert and oriented to person, place, and time. He has normal reflexes. No cranial nerve deficit. He exhibits normal muscle tone. Coordination normal.  Skin: Skin is warm. No rash noted. He is not diaphoretic. No erythema. No pallor.  Psychiatric: He has a normal mood and affect. His behavior is normal. Judgment and thought content normal.  Vitals reviewed.         Assessment & Plan:  Benign essential HTN  Hypogonadism in male - Plan: CBC with Differential/Platelet, COMPLETE METABOLIC PANEL WITH GFR, Lipid panel, PSA, Testosterone Total,Free,Bio, Males  Left tennis elbow - Plan:  Ambulatory referral to Orthopedic Surgery   I believe the patient has lateral epicondylitis however I cannot exclude a small tear in the tendon group.  Patient would like to see an orthopedist prior to receiving cortisone injection.  I will happily arrange that consultation.  Regarding his hypogonadism, he is asymptomatic.  I will check a testosterone level, CBC to rule out polycythemia, testosterone level, and a PSA to rule out prostate cancer.  His blood pressure is well controlled.  I will make no changes in his antihypertensives however I will check a CMP and a fasting lipid panel to monitor his cholesterol and blood sugar.  I congratulated the patient on his weight loss.

## 2017-07-24 ENCOUNTER — Other Ambulatory Visit: Payer: Self-pay | Admitting: *Deleted

## 2017-07-24 DIAGNOSIS — M25522 Pain in left elbow: Secondary | ICD-10-CM | POA: Insufficient documentation

## 2017-07-24 DIAGNOSIS — E291 Testicular hypofunction: Secondary | ICD-10-CM

## 2017-07-24 LAB — COMPLETE METABOLIC PANEL WITH GFR
AG RATIO: 1.6 (calc) (ref 1.0–2.5)
ALT: 36 U/L (ref 9–46)
AST: 40 U/L (ref 10–40)
Albumin: 4.9 g/dL (ref 3.6–5.1)
Alkaline phosphatase (APISO): 60 U/L (ref 40–115)
BILIRUBIN TOTAL: 1 mg/dL (ref 0.2–1.2)
BUN: 11 mg/dL (ref 7–25)
CALCIUM: 9.7 mg/dL (ref 8.6–10.3)
CHLORIDE: 98 mmol/L (ref 98–110)
CO2: 28 mmol/L (ref 20–32)
Creat: 1.03 mg/dL (ref 0.60–1.35)
GFR, Est African American: 100 mL/min/{1.73_m2} (ref >=60)
GFR, Est Non African American: 86 mL/min/{1.73_m2} (ref >=60)
GLUCOSE: 80 mg/dL (ref 65–99)
Globulin: 3 g/dL (calc) (ref 1.9–3.7)
POTASSIUM: 4.3 mmol/L (ref 3.5–5.3)
Sodium: 138 mmol/L (ref 135–146)
Total Protein: 7.9 g/dL (ref 6.1–8.1)

## 2017-07-24 LAB — CBC WITH DIFFERENTIAL/PLATELET
Basophils Absolute: 32 cells/uL (ref 0–200)
Basophils Relative: 0.8 %
Eosinophils Absolute: 40 cells/uL (ref 15–500)
Eosinophils Relative: 1 %
HCT: 51.4 % — ABNORMAL HIGH (ref 38.5–50.0)
Hemoglobin: 18.2 g/dL — ABNORMAL HIGH (ref 13.2–17.1)
Lymphs Abs: 1364 cells/uL (ref 850–3900)
MCH: 32.3 pg (ref 27.0–33.0)
MCHC: 35.4 g/dL (ref 32.0–36.0)
MCV: 91.1 fL (ref 80.0–100.0)
MONOS PCT: 16 %
MPV: 10.7 fL (ref 7.5–12.5)
NEUTROS ABS: 1924 {cells}/uL (ref 1500–7800)
NEUTROS PCT: 48.1 %
PLATELETS: 224 10*3/uL (ref 140–400)
RBC: 5.64 10*6/uL (ref 4.20–5.80)
RDW: 12.9 % (ref 11.0–15.0)
TOTAL LYMPHOCYTE: 34.1 %
WBC: 4 10*3/uL (ref 3.8–10.8)
WBCMIX: 640 {cells}/uL (ref 200–950)

## 2017-07-24 LAB — TESTOSTERONE TOTAL,FREE,BIO, MALES
ALBUMIN MSPROF: 4.9 g/dL (ref 3.6–5.1)
SEX HORMONE BINDING: 34 nmol/L (ref 10–50)
Testosterone, Bioavailable: 530.6 ng/dL (ref >=110.0)
Testosterone, Free: 237.9 pg/mL — ABNORMAL HIGH (ref 46.0–224.0)
Testosterone: 1417 ng/dL — ABNORMAL HIGH (ref 250–827)

## 2017-07-24 LAB — LIPID PANEL
Cholesterol: 241 mg/dL — ABNORMAL HIGH (ref <200)
HDL: 112 mg/dL (ref >40)
LDL Cholesterol (Calc): 114 mg/dL (calc) — ABNORMAL HIGH
Non-HDL Cholesterol (Calc): 129 mg/dL (calc) (ref <130)
TRIGLYCERIDES: 66 mg/dL (ref <150)
Total CHOL/HDL Ratio: 2.2 (calc) (ref <5.0)

## 2017-07-24 LAB — PSA: PSA: 0.5 ng/mL (ref <=4.0)

## 2017-07-25 MED ORDER — TESTOSTERONE CYPIONATE 200 MG/ML IM SOLN: INTRAMUSCULAR | 0 refills | Status: DC

## 2018-01-11 ENCOUNTER — Other Ambulatory Visit: Payer: Self-pay | Admitting: Family Medicine

## 2018-01-19 ENCOUNTER — Telehealth: Payer: Self-pay | Admitting: Family Medicine

## 2018-01-19 DIAGNOSIS — E291 Testicular hypofunction: Secondary | ICD-10-CM

## 2018-01-19 MED ORDER — TESTOSTERONE CYPIONATE 200 MG/ML IM SOLN: INTRAMUSCULAR | 0 refills | Status: DC

## 2018-01-19 NOTE — Telephone Encounter (Signed)
Spoke with patient and informed him that refill was sent in and will need a office visit and labs. Patient verbalized understanding.

## 2018-01-19 NOTE — Telephone Encounter (Signed)
Patient called in requesting a refill on his Testosterone. Please advise?

## 2018-01-19 NOTE — Telephone Encounter (Signed)
I sent a refill to [facility] however he will need office visit labs before his next refill

## 2018-01-20 ENCOUNTER — Other Ambulatory Visit: Payer: Self-pay | Admitting: Family Medicine

## 2018-03-10 ENCOUNTER — Other Ambulatory Visit: Payer: Self-pay | Admitting: Family Medicine

## 2018-04-03 ENCOUNTER — Other Ambulatory Visit: Payer: BLUE CROSS/BLUE SHIELD

## 2018-04-03 DIAGNOSIS — R7989 Other specified abnormal findings of blood chemistry: Secondary | ICD-10-CM

## 2018-04-03 DIAGNOSIS — D582 Other hemoglobinopathies: Secondary | ICD-10-CM

## 2018-04-03 DIAGNOSIS — Z79890 Hormone replacement therapy (postmenopausal): Secondary | ICD-10-CM

## 2018-04-04 LAB — CBC WITH DIFFERENTIAL/PLATELET
ABSOLUTE MONOCYTES: 928 {cells}/uL (ref 200–950)
Basophils Absolute: 17 cells/uL (ref 0–200)
Basophils Relative: 0.3 %
EOS ABS: 17 {cells}/uL (ref 15–500)
Eosinophils Relative: 0.3 %
HEMATOCRIT: 45 % (ref 38.5–50.0)
HEMOGLOBIN: 15.9 g/dL (ref 13.2–17.1)
LYMPHS ABS: 1404 {cells}/uL (ref 850–3900)
MCH: 32.5 pg (ref 27.0–33.0)
MCHC: 35.3 g/dL (ref 32.0–36.0)
MCV: 92 fL (ref 80.0–100.0)
MONOS PCT: 16 %
MPV: 10.6 fL (ref 7.5–12.5)
NEUTROS ABS: 3434 {cells}/uL (ref 1500–7800)
Neutrophils Relative %: 59.2 %
Platelets: 243 10*3/uL (ref 140–400)
RBC: 4.89 10*6/uL (ref 4.20–5.80)
RDW: 12.8 % (ref 11.0–15.0)
Total Lymphocyte: 24.2 %
WBC: 5.8 10*3/uL (ref 3.8–10.8)

## 2018-04-04 LAB — TESTOSTERONE: Testosterone: 322 ng/dL (ref 250–827)

## 2018-04-04 LAB — PSA: PSA: 0.4 ng/mL (ref <=4.0)

## 2018-04-06 ENCOUNTER — Ambulatory Visit (INDEPENDENT_AMBULATORY_CARE_PROVIDER_SITE_OTHER): Payer: BLUE CROSS/BLUE SHIELD | Admitting: Family Medicine

## 2018-04-06 VITALS — BP 152/98 | HR 60 | Temp 98.1°F | Resp 18 | Wt 218.0 lb

## 2018-04-06 DIAGNOSIS — I10 Essential (primary) hypertension: Secondary | ICD-10-CM | POA: Diagnosis not present

## 2018-04-06 DIAGNOSIS — E291 Testicular hypofunction: Secondary | ICD-10-CM

## 2018-04-06 MED ORDER — TESTOSTERONE CYPIONATE 200 MG/ML IM SOLN: 150.0000 mg | INTRAMUSCULAR | 0 refills | Status: DC

## 2018-04-06 NOTE — Progress Notes (Signed)
Subjective:    Patient ID: Miguel Mathis, male    DOB: 10/09/1970, 48 y.o.   MRN: [id number]  Hypertension     10/2015 Patient is a 49 year old white male here today for physical exam.  His wife recently had a baby. Unfortunately the baby is in the NICU with a pneumothorax, hypoglycemia, and infection. I will see the patient is very concerned about this. Since last time I saw the patient, he has gained 34 pounds. He is started a job that is very sedentary. He is not exercising. His blood pressure has increased. His LDL closer has increased as well is evidence to his lab work below. His HDL cholesterol has dropped although it is still excellent. He does complain of daily headaches. The patient was sitting in a chair approximately 4 weeks ago when the chair suddenly broke for no reason causing the patient to fall to the ground and strike his head on a table. The injury occurred to his occiput. He is still tender to palpation in that area. He was momentarily stunned although he denies any loss of consciousness. Since that time he has had headaches. The headaches are worse with strenuous activity and concentration and stress. They're pulsatile in nature. They improve with rest. They sound almost migraine in nature like the patient may have suffered a postconcussive headache/disorder. He also complains of fluid in his inner ear. On examination today the ear canals are clear. He has a small middle ear effusion.  At that time, my plan was: Physical exam is significant for weight gain of 34 pounds, borderline blood pressure, and elevated LDL cholesterol. I have recommended 30 minutes a day 5 days a week of aerobic exercise, 15-20 pounds weight loss, and a low sodium diet to address. I will recheck labs in 6 months. The remainder of his lab work is normal. I believe he suffered a concussion when he fell and now has a postconcussive headache. I recommended tincture of time and rest. Should the headache intensify, I  would proceed with a CT scan although based on his normal neurologic exam I believe the risk of an intercranial hemorrhage is virtually 0 given the length of time since the injury. I recommended Flonase and Zyrtec for middle ear effusion. If no better consider an ENT referral for tympanostomy tube placement  04/04/16 Went to another doctor and ws started on bystolic 5 mg poqday.  Has failed lisinopril due to cough in past and valsartan caused angioedema.  On bystolic, reports ED, fatigue, and BP is 140's/90's.  At that time, my plan was: DC bystolic.  Start hctz 25 mg poqday and recheck bp in 3-4 weeks.  Check cbc, cmp, testosterone.     05/06/16 Here for follow up.  His blood pressure today is borderline on hydrochlorothiazide but is much better. He also feels better than he did on bystolic however he continues to have erectile problems, fatigue, poor libido. Testosterone levels were extremely low. He is interested in testosterone replacement. He also reports occasional panic attacks and daily anxiety.  Bystolic seem to help this when he took it however he did not like the way the medicine made him feel. 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 He is interested in possible options to treat anxiety.  At that time, my plan was: Blood pressure is borderline. I think with increased exercise and weight loss his blood pressure will be at goal. I'm hoping that by replacing his testosterone with testosterone cypionate 200 mg IM  every 2 weeks, patient will have more energy and drive and can start exercising more which managed to help his blood pressure. I will repeat a testosterone level today to verify and then I will start him on testosterone replacement. We discussed the risk of cardiovascular disease, prostate cancer, and low sperm counts. He can consider starting something like Lexapro for anxiety but I would first like to start testosterone and see how he feels on the medication before making any  further changes.  08/15/16  Patient feels much better on testosterone.  Energy level has improved dramatically.  Denies any rage, dysuria, hesitancy, headaches, chest pain, sob, or doe, or pleurisy.  However his blood pressure today remains elevated even on hydrochlorothiazide despite the fact he is back in the gym. In the past he has tried lisinopril and experienced a cough. He tried valsartan and experienced angioedema. He tried by systolic and experienced fatigue and it did not work very well.  At that time, my plan was: Symptomatically, the patient has improved. Check a testosterone level to ensure that the levels are.supratherapeutic. Check PSA as well as a CBC to evaluate for polycythemia. If levels are normal, I will make no change. Regarding his blood pressure I will add amlodipine 10 mg a day and recheck blood pressure in one month.  09/16/16 Based on his elevated hemoglobin, I did recommend decreasing his dose of testosterone to avoid worsening polycythemia. He is here today however mainly to recheck his blood pressure since the addition of amlodipine.  Since starting amlodipine, his blood pressure has improved but he has significant edema in his legs that he cannot tolerate.  At that time, my plan XBM:[id number] amlodipine and replaced bystolic 5 mg a day and recheck blood pressure in one month. If he cannot tolerate this medication, I would next try cardura.  Cannot take ACE inhibitor or angiotensin receptor blockers due to a history of angioedema.  07/21/17 Since I last saw the patient, he has successfully lost substantial weight.  He is eating more salads and limiting his intake of carbohydrates.  He is also exercising regularly at the gym.  As a result his blood pressure is much better.  He denies any chest pain shortness of breath or dyspnea on exertion.  He is still using testosterone but he denies any fatigue, decreased libido, erectile dysfunction.  He is overdue to check a PSA, CBC, and  a testosterone level.  He is also due for fasting lab work to monitor his cholesterol.  Several weeks ago, he was lifting a heavy dolly at work when he felt a pop in his left elbow near the lateral epicondyle.  He is now exquisitely tender to palpation over the lateral epicondyle.  Resisted gripping and resisted wrist dorsiflexion cause extreme pain in that area.  He is concerned that he may have torn the tendon rather than have lateral epicondylitis.  His strength is still very good however he is a muscular patient and therefore his strength may be masking a small tear. At that time, my plan was: I believe the patient has lateral epicondylitis however I cannot exclude a small tear in the tendon group.  Patient would like to see an orthopedist prior to receiving cortisone injection.  I will happily arrange that consultation.  Regarding his hypogonadism, he is asymptomatic.  I will check a testosterone level, CBC to rule out polycythemia, testosterone level, and a PSA to rule out prostate cancer.  His blood pressure is well controlled.  I  will make no changes in his antihypertensives however I will check a CMP and a fasting lipid panel to monitor his cholesterol and blood sugar.  I congratulated the patient on his weight loss.  04/06/18 Most recent labs are below: Appointment on 04/03/2018  Component Date Value Ref Range Status  . Testosterone 04/03/2018 322  250 - 827 ng/dL Final  . PSA 04/03/2018 0.4  < OR = 4.0 ng/mL Final   Comment: The total PSA value from this assay system is  standardized against the WHO standard. The test  result will be approximately 20% lower when compared  to the equimolar-standardized total PSA (Beckman  Coulter). Comparison of serial PSA results should be  interpreted with this fact in mind. . This test was performed using the Siemens  chemiluminescent method. Values obtained from  different assay methods cannot be used interchangeably. PSA levels, regardless  of value, should not be interpreted as absolute evidence of the presence or absence of disease.   . WBC 04/03/2018 5.8  3.8 - 10.8 Thousand/uL Final  . RBC 04/03/2018 4.89  4.20 - 5.80 Million/uL Final  . Hemoglobin 04/03/2018 15.9  13.2 - 17.1 g/dL Final  . HCT 04/03/2018 45.0  38.5 - 50.0 % Final  . MCV 04/03/2018 92.0  80.0 - 100.0 fL Final  . MCH 04/03/2018 32.5  27.0 - 33.0 pg Final  . MCHC 04/03/2018 35.3  32.0 - 36.0 g/dL Final  . RDW 04/03/2018 12.8  11.0 - 15.0 % Final  . Platelets 04/03/2018 243  140 - 400 Thousand/uL Final  . MPV 04/03/2018 10.6  7.5 - 12.5 fL Final  . Neutro Abs 04/03/2018 3,434  1,500 - 7,800 cells/uL Final  . Lymphs Abs 04/03/2018 1,404  850 - 3,900 cells/uL Final  . Absolute Monocytes 04/03/2018 928  200 - 950 cells/uL Final  . Eosinophils Absolute 04/03/2018 17  15 - 500 cells/uL Final  . Basophils Absolute 04/03/2018 17  0 - 200 cells/uL Final  . Neutrophils Relative % 04/03/2018 59.2  % Final  . Total Lymphocyte 04/03/2018 24.2  % Final  . Monocytes Relative 04/03/2018 16.0  % Final  . Eosinophils Relative 04/03/2018 0.3  % Final  . Basophils Relative 04/03/2018 0.3  % Final   Patient feels more fatigue since reducing his testosterone from 200 mg every 2 weeks to 100 mg every 2 weeks however his hemoglobin improved dramatically.  At the end of his 2-week span, his testosterone level is still in the normal range albeit in the low end of the normal range at 322.  However he reports more fatigue, decreased libido and would like to try to increase the dose slightly if possible.  He denies any chest pain shortness of breath or dyspnea on exertion.  His blood pressure today is elevated however he states he checks his blood pressure every day at home and it is consistently less than 140/90.  He is compliant with his hydrochlorothiazide and Bystolic and hesitates to raise the dose of Bystolic any further given the fatigue he is already experiencing Past Medical  History:  Diagnosis Date  . Basal cell cancer   . Hypertension   . Hypogonadism in male    Past Surgical History:  Procedure Laterality Date  . KNEE ARTHROSCOPY     Current Outpatient Medications on File Prior to Visit  Medication Sig Dispense Refill  . BYSTOLIC 5 MG tablet TAKE 1 TABLET BY MOUTH EVERY DAY 90 tablet 1  . fluticasone (FLONASE) 50 MCG/ACT  nasal spray PLACE 2 SPRAYS INTO BOTH NOSTRILS DAILY. 48 g 3  . hydrochlorothiazide (HYDRODIURIL) 25 MG tablet TAKE 1 TABLET (25 MG TOTAL) BY MOUTH DAILY. 90 tablet 3  . nebivolol (BYSTOLIC) 5 MG tablet Take 1 tablet (5 mg total) daily by mouth. 90 tablet 3  . testosterone cypionate (DEPOTESTOSTERONE CYPIONATE) 200 MG/ML injection INJECT 0.5ML INTO THE MUSCLE EVERY 14 DAYS 10 mL 0   No current facility-administered medications on file prior to visit.     Allergies  Allergen Reactions  . Diovan [Valsartan] Swelling   Social History   Socioeconomic History  . Marital status: Married    Spouse name: Not on file  . Number of children: Not on file  . Years of education: Not on file  . Highest education level: Not on file  Occupational History  . Not on file  Social Needs  . Financial resource strain: Not on file  . Food insecurity:    Worry: Not on file    Inability: Not on file  . Transportation needs:    Medical: Not on file    Non-medical: Not on file  Tobacco Use  . Smoking status: Never Smoker  . Smokeless tobacco: Current User    Types: Snuff  Substance and Sexual Activity  . Alcohol use: Yes    Alcohol/week: 6.0 standard drinks    Types: 6 Cans of beer per week  . Drug use: No  . 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 Physical activity:    Days per week: Not on file    Minutes per session: Not on file  . Stress: Not on file  Relationships  . Social connections:    Talks on phone: Not on file    Gets together: Not on file    Attends religious service: Not on file     Active member of club or organization: Not on file    Attends meetings of clubs or organizations: Not on file    Relationship status: Not on file  . Intimate partner violence:    Fear of current or ex partner: Not on file    Emotionally abused: Not on file    Physically abused: Not on file    Forced sexual activity: Not on file  Other Topics Concern  . Not on file  Social History Narrative  . Not on file   Family History  Problem Relation Age of Onset  . Cancer Paternal Aunt        breast  . Cancer Paternal Uncle        lung  . Cancer Paternal Uncle        brain  . Cancer Paternal Uncle        unknown to pt      Review of Systems  All other systems reviewed and are negative.      Objective:   Physical Exam  Constitutional: He is oriented to person, place, and time. He appears well-developed and well-nourished. No distress.  HENT:  Head: Normocephalic and atraumatic.  Right Ear: External ear normal.  Left Ear: External ear normal.  Nose: Nose normal.  Mouth/Throat: Oropharynx is clear and moist. No oropharyngeal exudate.  Eyes: Pupils are equal, round, and reactive to light. Conjunctivae and EOM are normal. Right eye exhibits no discharge. Left eye exhibits no discharge. No scleral icterus.  Neck: Normal range of motion. Neck supple. No JVD present. No tracheal deviation present. No thyromegaly  present.  Cardiovascular: Normal rate, regular rhythm, normal heart sounds and intact distal pulses. Exam reveals no gallop and no friction rub.  No murmur heard. Pulmonary/Chest: Effort normal and breath sounds normal. No stridor. No respiratory distress. He has no wheezes. He has no rales. He exhibits no tenderness.  Abdominal: Soft. Bowel sounds are normal. He exhibits no distension and no mass. There is no abdominal tenderness. There is no rebound and no guarding.  Musculoskeletal: Normal range of motion.        General: No tenderness or edema.  Lymphadenopathy:    He has  no cervical adenopathy.  Neurological: He is alert and oriented to person, place, and time. He has normal reflexes. No cranial nerve deficit. He exhibits normal muscle tone. Coordination normal.  Skin: Skin is warm. No rash noted. He is not diaphoretic. No erythema. No pallor.  Psychiatric: He has a normal mood and affect. His behavior is normal. Judgment and thought content normal.  Vitals reviewed.         Assessment & Plan:   Benign essential HTN  Hypogonadism in male - Plan: testosterone cypionate (DEPOTESTOSTERONE CYPIONATE) 200 MG/ML injection  Blood pressure here today is elevated however his home blood pressures are acceptable.  Make no changes in his hydrochlorothiazide or Bystolic.  Increase his testosterone from 100 mg every 2 weeks to 150 mg every 2 weeks and recheck lab work in 6 months.

## 2018-04-13 ENCOUNTER — Other Ambulatory Visit: Payer: Self-pay | Admitting: Family Medicine

## 2018-04-13 DIAGNOSIS — E291 Testicular hypofunction: Secondary | ICD-10-CM

## 2018-04-13 MED ORDER — TESTOSTERONE CYPIONATE 200 MG/ML IM SOLN: 150.0000 mg | INTRAMUSCULAR | 0 refills | Status: DC

## 2018-09-06 ENCOUNTER — Other Ambulatory Visit: Payer: Self-pay | Admitting: Family Medicine

## 2018-11-28 ENCOUNTER — Other Ambulatory Visit: Payer: Self-pay | Admitting: Family Medicine

## 2018-11-28 DIAGNOSIS — E291 Testicular hypofunction: Secondary | ICD-10-CM

## 2018-11-28 NOTE — Telephone Encounter (Signed)
Ok to refill??  Last office visit 04/06/2018.  Last refill 04/13/2018.

## 2019-02-23 ENCOUNTER — Other Ambulatory Visit: Payer: Self-pay | Admitting: Family Medicine

## 2019-02-24 ENCOUNTER — Other Ambulatory Visit: Payer: Self-pay | Admitting: Family Medicine

## 2019-03-02 ENCOUNTER — Other Ambulatory Visit: Payer: Self-pay | Admitting: Family Medicine

## 2019-03-26 ENCOUNTER — Other Ambulatory Visit: Payer: Self-pay | Admitting: Family Medicine

## 2019-03-29 ENCOUNTER — Other Ambulatory Visit: Payer: Self-pay

## 2019-03-29 ENCOUNTER — Encounter: Payer: Self-pay | Admitting: Family Medicine

## 2019-03-29 ENCOUNTER — Ambulatory Visit: Payer: BLUE CROSS/BLUE SHIELD | Admitting: Family Medicine

## 2019-03-29 VITALS — BP 122/84 | HR 58 | Temp 98.2°F | Resp 12 | Ht 72.0 in | Wt 202.0 lb

## 2019-03-29 DIAGNOSIS — E291 Testicular hypofunction: Secondary | ICD-10-CM | POA: Diagnosis not present

## 2019-03-29 DIAGNOSIS — I10 Essential (primary) hypertension: Secondary | ICD-10-CM | POA: Diagnosis not present

## 2019-03-29 NOTE — Progress Notes (Signed)
Subjective:    Patient ID: Miguel Mathis, male    DOB: [birth year], 49 y.o.   MRN: [id number]    07/21/17 Since I last saw the patient, he has successfully lost substantial weight.  He is eating more salads and limiting his intake of carbohydrates.  He is also exercising regularly at the gym.  As a result his blood pressure is much better.  He denies any chest pain shortness of breath or dyspnea on exertion.  He is still using testosterone but he denies any fatigue, decreased libido, erectile dysfunction.  He is overdue to check a PSA, CBC, and a testosterone level.  He is also due for fasting lab work to monitor his cholesterol.  Several weeks ago, he was lifting a heavy dolly at work when he felt a pop in his left elbow near the lateral epicondyle.  He is now exquisitely tender to palpation over the lateral epicondyle.  Resisted gripping and resisted wrist dorsiflexion cause extreme pain in that area.  He is concerned that he may have torn the tendon rather than have lateral epicondylitis.  His strength is still very good however he is a muscular patient and therefore his strength may be masking a small tear. At that time, my plan was: I believe the patient has lateral epicondylitis however I cannot exclude a small tear in the tendon group.  Patient would like to see an orthopedist prior to receiving cortisone injection.  I will happily arrange that consultation.  Regarding his hypogonadism, he is asymptomatic.  I will check a testosterone level, CBC to rule out polycythemia, testosterone level, and a PSA to rule out prostate cancer.  His blood pressure is well controlled.  I will make no changes in his antihypertensives however I will check a CMP and a fasting lipid panel to monitor his cholesterol and blood sugar.  I congratulated the patient on his weight loss.  04/06/18 Patient feels more fatigue since reducing his testosterone from 200 mg every 2 weeks to 100 mg every 2 weeks however his hemoglobin  improved dramatically.  At the end of his 2-week span, his testosterone level is still in the normal range albeit in the low end of the normal range at 322.  However he reports more fatigue, decreased libido and would like to try to increase the dose slightly if possible.  He denies any chest pain shortness of breath or dyspnea on exertion.  His blood pressure today is elevated however he states he checks his blood pressure every day at home and it is consistently less than 140/90.  He is compliant with his hydrochlorothiazide and Bystolic and hesitates to raise the dose of Bystolic any further given the fatigue he is already experiencing.  At that time, my plan was: Blood pressure here today is elevated however his home blood pressures are acceptable.  Make no changes in his hydrochlorothiazide or Bystolic.  Increase his testosterone from 100 mg every 2 weeks to 150 mg every 2 weeks and recheck lab work in 6 months.  03/29/19 Patient is a 49 year old Caucasian male here today for follow-up.  He continues to lose weight since I last saw him.  However he is losing weight in a healthy way.  He is eating salads every day for breakfast and lunch.  He is avoiding all fast food and fried food and he works out aggressively 5 days a week.  He is getting aerobic exercise.  He feels great.  His blood pressure is down to  122/84.  He denies any chest pain shortness of breath or dyspnea on exertion.  He is still taking testosterone 150 mg every 2 weeks.  He denies any fatigue or erectile dysfunction or poor libido.  He is overdue to check a CBC as well as a PSA. Past Medical History:  Diagnosis Date  . Basal cell cancer   . Hypertension   . Hypogonadism in male    Past Surgical History:  Procedure Laterality Date  . KNEE ARTHROSCOPY     Current Outpatient Medications on File Prior to Visit  Medication Sig Dispense Refill  . BYSTOLIC 5 MG tablet TAKE 1 TABLET BY MOUTH EVERY DAY 30 tablet 0  . fluticasone  (FLONASE) 50 MCG/ACT nasal spray PLACE 2 SPRAYS INTO BOTH NOSTRILS DAILY. 48 mL 3  . hydrochlorothiazide (HYDRODIURIL) 25 MG tablet TAKE 1 TABLET BY MOUTH EVERY DAY 90 tablet 3  . nebivolol (BYSTOLIC) 5 MG tablet Take 1 tablet (5 mg total) daily by mouth. 90 tablet 3  . testosterone cypionate (DEPOTESTOSTERONE CYPIONATE) 200 MG/ML injection INJECT 0.75 MILLILITERS INTO THE MUSCLE ONCE EVERY 14 DAYS 6 mL 0   No current facility-administered medications on file prior to visit.    Allergies  Allergen Reactions  . Diovan [Valsartan] Swelling   Social History   Socioeconomic History  . Marital status: Married    Spouse name: Not on file  . Number of children: Not on file  . Years of education: Not on file  . Highest education level: Not on file  Occupational History  . Not on file  Tobacco Use  . Smoking status: Never Smoker  . Smokeless tobacco: Current User    Types: Snuff  Substance and Sexual Activity  . Alcohol use: Yes    Alcohol/week: 6.0 standard drinks    Types: 6 Cans of beer per week  . Drug use: No  . 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 Not on file  Social History Narrative  . Not on file   Social Determinants of Health   Financial Resource Strain:   . Difficulty of Paying Living Expenses: Not on file  Food Insecurity:   . Worried About Charity fundraiser in the Last Year: Not on file  . Ran Out of Food in the Last Year: Not on file  Transportation Needs:   . Lack of Transportation (Medical): Not on file  . Lack of Transportation (Non-Medical): Not on file  Physical Activity:   . Days of Exercise per Week: Not on file  . Minutes of Exercise per Session: Not on file  Stress:   . Feeling of Stress : Not on file  Social Connections:   . Frequency of Communication with Friends and Family: Not on file  . Frequency of Social Gatherings with Friends and Family: Not on file  . Attends Religious Services: Not  on file  . Active Member of Clubs or Organizations: Not on file  . Attends Archivist Meetings: Not on file  . Marital Status: Not on file  Intimate Partner Violence:   . Fear of Current or Ex-Partner: Not on file  . Emotionally Abused: Not on file  . Physically Abused: Not on file  . Sexually Abused: Not on file   Family History  Problem Relation Age of Onset  . Cancer Paternal Aunt        breast  . Cancer Paternal Uncle  lung  . Cancer Paternal Uncle        brain  . Cancer Paternal Uncle        unknown to pt      Review of Systems  All other systems reviewed and are negative.      Objective:   Physical Exam  Constitutional: He is oriented to person, place, and time. He appears well-developed and well-nourished. No distress.  HENT:  Head: Normocephalic and atraumatic.  Right Ear: External ear normal.  Left Ear: External ear normal.  Nose: Nose normal.  Mouth/Throat: Oropharynx is clear and moist. No oropharyngeal exudate.  Eyes: Pupils are equal, round, and reactive to light. Conjunctivae and EOM are normal. Right eye exhibits no discharge. Left eye exhibits no discharge. No scleral icterus.  Neck: No JVD present. No tracheal deviation present. No thyromegaly present.  Cardiovascular: Normal rate, regular rhythm, normal heart sounds and intact distal pulses. Exam reveals no gallop and no friction rub.  No murmur heard. Pulmonary/Chest: Effort normal and breath sounds normal. No stridor. No respiratory distress. He has no wheezes. He has no rales. He exhibits no tenderness.  Abdominal: Soft. Bowel sounds are normal. He exhibits no distension and no mass. There is no abdominal tenderness. There is no rebound and no guarding.  Musculoskeletal:        General: No tenderness or edema. Normal range of motion.     Cervical back: Normal range of motion and neck supple.  Lymphadenopathy:    He has no cervical adenopathy.  Neurological: He is alert and oriented  to person, place, and time. He has normal reflexes. No cranial nerve deficit. He exhibits normal muscle tone. Coordination normal.  Skin: Skin is warm. No rash noted. He is not diaphoretic. No erythema. No pallor.  Psychiatric: He has a normal mood and affect. His behavior is normal. Judgment and thought content normal.  Vitals reviewed.         Assessment & Plan:  Benign essential HTN - Plan: COMPLETE METABOLIC PANEL WITH GFR, Lipid panel  Hypogonadism in male - Plan: CBC with Differential/Platelet, COMPLETE METABOLIC PANEL WITH GFR, Lipid panel, PSA, Testosterone Total,Free,Bio, Males  Blood pressure today is outstanding.  We will make no changes in his antihypertensives.  I will check a CMP and a fasting lipid panel.  Ideally I like his LDL cholesterol to be below 100.  I continue to congratulate the patient on his weight loss and his healthy lifestyle changes.  Check a PSA to monitor for any enlargement due to his testosterone replacement.  Check a CBC to monitor for any evidence of secondary polycythemia.  Monitor a testosterone level however symptomatically the patient is doing excellent.

## 2019-04-01 ENCOUNTER — Other Ambulatory Visit: Payer: Self-pay | Admitting: Family Medicine

## 2019-04-01 LAB — COMPLETE METABOLIC PANEL WITH GFR
AG Ratio: 1.5 (calc) (ref 1.0–2.5)
ALT: 40 U/L (ref 9–46)
AST: 30 U/L (ref 10–40)
Albumin: 4.7 g/dL (ref 3.6–5.1)
Alkaline phosphatase (APISO): 42 U/L (ref 36–130)
BUN: 21 mg/dL (ref 7–25)
CO2: 27 mmol/L (ref 20–32)
Calcium: 10.2 mg/dL (ref 8.6–10.3)
Chloride: 98 mmol/L (ref 98–110)
Creat: 1.17 mg/dL (ref 0.60–1.35)
GFR, Est African American: 85 mL/min/{1.73_m2} (ref >=60)
GFR, Est Non African American: 73 mL/min/{1.73_m2} (ref >=60)
Globulin: 3.1 g/dL (calc) (ref 1.9–3.7)
Glucose, Bld: 76 mg/dL (ref 65–99)
Potassium: 4.2 mmol/L (ref 3.5–5.3)
Sodium: 136 mmol/L (ref 135–146)
Total Bilirubin: 1 mg/dL (ref 0.2–1.2)
Total Protein: 7.8 g/dL (ref 6.1–8.1)

## 2019-04-01 LAB — CBC WITH DIFFERENTIAL/PLATELET
Absolute Monocytes: 1009 cells/uL — ABNORMAL HIGH (ref 200–950)
Basophils Absolute: 41 cells/uL (ref 0–200)
Basophils Relative: 0.7 %
Eosinophils Absolute: 29 cells/uL (ref 15–500)
Eosinophils Relative: 0.5 %
HCT: 48.3 % (ref 38.5–50.0)
Hemoglobin: 16.8 g/dL (ref 13.2–17.1)
Lymphs Abs: 1508 cells/uL (ref 850–3900)
MCH: 32.7 pg (ref 27.0–33.0)
MCHC: 34.8 g/dL (ref 32.0–36.0)
MCV: 94 fL (ref 80.0–100.0)
MPV: 9.9 fL (ref 7.5–12.5)
Monocytes Relative: 17.4 %
Neutro Abs: 3213 cells/uL (ref 1500–7800)
Neutrophils Relative %: 55.4 %
Platelets: 282 10*3/uL (ref 140–400)
RBC: 5.14 10*6/uL (ref 4.20–5.80)
RDW: 12.9 % (ref 11.0–15.0)
Total Lymphocyte: 26 %
WBC: 5.8 10*3/uL (ref 3.8–10.8)

## 2019-04-01 LAB — LIPID PANEL
Cholesterol: 196 mg/dL (ref <200)
HDL: 93 mg/dL (ref >=40)
LDL Cholesterol (Calc): 87 mg/dL (calc)
Non-HDL Cholesterol (Calc): 103 mg/dL (calc) (ref <130)
Total CHOL/HDL Ratio: 2.1 (calc) (ref <5.0)
Triglycerides: 71 mg/dL (ref <150)

## 2019-04-01 LAB — PSA: PSA: 0.5 ng/mL (ref <=4.0)

## 2019-04-01 LAB — TESTOSTERONE TOTAL,FREE,BIO, MALES
Albumin: 4.7 g/dL (ref 3.6–5.1)
Sex Hormone Binding: 33 nmol/L (ref 10–50)
Testosterone, Bioavailable: 85.3 ng/dL — ABNORMAL LOW (ref >=110.0)
Testosterone, Free: 39.8 pg/mL — ABNORMAL LOW (ref 46.0–224.0)
Testosterone: 316 ng/dL (ref 250–827)

## 2019-04-01 MED ORDER — NEBIVOLOL HCL 5 MG PO TABS: 5.0000 mg | ORAL_TABLET | Freq: Every day | ORAL | 3 refills | Status: DC

## 2019-04-09 ENCOUNTER — Other Ambulatory Visit: Payer: Self-pay | Admitting: Family Medicine

## 2019-04-09 MED ORDER — NEBIVOLOL HCL 5 MG PO TABS: 5.0000 mg | ORAL_TABLET | Freq: Every day | ORAL | 3 refills | Status: DC

## 2019-04-10 ENCOUNTER — Other Ambulatory Visit: Payer: Self-pay | Admitting: Family Medicine

## 2019-04-10 DIAGNOSIS — E291 Testicular hypofunction: Secondary | ICD-10-CM

## 2019-04-10 NOTE — Telephone Encounter (Signed)
Requesting refill    Testosterone  LOV: 03/29/99  LRF:  11/29/2018

## 2019-04-11 MED ORDER — TESTOSTERONE CYPIONATE 200 MG/ML IM SOLN: INTRAMUSCULAR | 2 refills | Status: DC

## 2019-12-17 ENCOUNTER — Other Ambulatory Visit: Payer: Self-pay | Admitting: Family Medicine

## 2019-12-17 DIAGNOSIS — E291 Testicular hypofunction: Secondary | ICD-10-CM

## 2019-12-17 NOTE — Telephone Encounter (Signed)
Received fax from [facility] in [place] requesting a refill on testosterone cyp 200 mg/ml SIg: Inject 0.75 Milliters into the muscle once every 14days

## 2019-12-18 NOTE — Telephone Encounter (Signed)
Last office visit - 03/29/19   Last refilled on  -  04/11/19

## 2019-12-19 MED ORDER — TESTOSTERONE CYPIONATE 200 MG/ML IM SOLN: INTRAMUSCULAR | 2 refills | Status: DC

## 2020-02-20 ENCOUNTER — Other Ambulatory Visit: Payer: Self-pay | Admitting: Family Medicine

## 2020-07-06 ENCOUNTER — Telehealth: Payer: Self-pay | Admitting: Family Medicine

## 2020-07-06 MED ORDER — NEBIVOLOL HCL 5 MG PO TABS: 5.0000 mg | ORAL_TABLET | Freq: Every day | ORAL | 0 refills | Status: DC

## 2020-07-06 NOTE — Telephone Encounter (Signed)
Pt called needing a refill of  nebivolol (BYSTOLIC) 5 MG tablet   Cb#: [phone]

## 2020-10-02 ENCOUNTER — Other Ambulatory Visit: Payer: Self-pay | Admitting: Family Medicine

## 2021-01-02 ENCOUNTER — Other Ambulatory Visit: Payer: Self-pay | Admitting: Family Medicine

## 2021-01-02 DIAGNOSIS — I10 Essential (primary) hypertension: Secondary | ICD-10-CM

## 2021-01-04 NOTE — Telephone Encounter (Signed)
Rx sent. Pt needs to schedule OV, has not been seen since 03/2019. LMVM for pt to return call.

## 2021-02-03 ENCOUNTER — Other Ambulatory Visit: Payer: Self-pay | Admitting: Family Medicine

## 2021-02-03 DIAGNOSIS — I10 Essential (primary) hypertension: Secondary | ICD-10-CM

## 2021-02-15 ENCOUNTER — Other Ambulatory Visit: Payer: Self-pay

## 2021-02-15 ENCOUNTER — Ambulatory Visit (INDEPENDENT_AMBULATORY_CARE_PROVIDER_SITE_OTHER): Payer: BC Managed Care – PPO | Admitting: Family Medicine

## 2021-02-15 ENCOUNTER — Encounter: Payer: Self-pay | Admitting: Family Medicine

## 2021-02-15 VITALS — BP 148/102 | HR 62 | Resp 18 | Ht 72.0 in | Wt 208.0 lb

## 2021-02-15 DIAGNOSIS — E291 Testicular hypofunction: Secondary | ICD-10-CM

## 2021-02-15 DIAGNOSIS — Z1211 Encounter for screening for malignant neoplasm of colon: Secondary | ICD-10-CM

## 2021-02-15 DIAGNOSIS — I10 Essential (primary) hypertension: Secondary | ICD-10-CM

## 2021-02-15 DIAGNOSIS — F411 Generalized anxiety disorder: Secondary | ICD-10-CM

## 2021-02-15 MED ORDER — ESCITALOPRAM OXALATE 10 MG PO TABS: 10.0000 mg | ORAL_TABLET | Freq: Every day | ORAL | 5 refills | Status: DC

## 2021-02-15 NOTE — Progress Notes (Signed)
Subjective:    Patient ID: Miguel Mathis, male    DOB: [birth year], 50 y.o.   MRN: [id number]   03/29/19 Patient is a 50 year old Caucasian male here today for follow-up.  He continues to lose weight since I last saw him.  However he is losing weight in a healthy way.  He is eating salads every day for breakfast and lunch.  He is avoiding all fast food and fried food and he works out aggressively 5 days a week.  He is getting aerobic exercise.  He feels great.  His blood pressure is down to 122/84.  He denies any chest pain shortness of breath or dyspnea on exertion.  He is still taking testosterone 150 mg every 2 weeks.  He denies any fatigue or erectile dysfunction or poor libido.  He is overdue to check a CBC as well as a PSA.  At that time, my plan was:  Blood pressure today is outstanding.  We will make no changes in his antihypertensives.  I will check a CMP and a fasting lipid panel.  Ideally I like his LDL cholesterol to be below 100.  I continue to congratulate the patient on his weight loss and his healthy lifestyle changes.  Check a PSA to monitor for any enlargement due to his testosterone replacement.  Check a CBC to monitor for any evidence of secondary polycythemia.  Monitor a testosterone level however symptomatically the patient is doing excellent.   02/15/21 Patient is here today for a checkup.  He is on 150 mg of testosterone every 2 weeks.  He states without the medication, he has severe fatigue, low libido, and erectile dysfunction.  On the medication he feels much better.  However his blood pressure today is concerning.  It is 148/100 today.  He denies any chest pain shortness of breath or dyspnea on exertion.  He does report anxiety on a daily basis.  He states he always feels anxious.  He drives a truck for living.  He frequently loses his temper and experiences road rage.  He states that he can fly off the handle quickly.  I explained to him that testosterone can contribute to that  but he states is not "roid rage".  Instead, he just feels on edge and anxious all the time.  He denies any depression or suicidal ideation or homicidal ideation or anhedonia.  He does have occasional panic attacks. Past Medical History:  Diagnosis Date   Basal cell cancer    Hypertension    Hypogonadism in male    Past Surgical History:  Procedure Laterality Date   KNEE ARTHROSCOPY     Current Outpatient Medications on File Prior to Visit  Medication Sig Dispense Refill   fluticasone (FLONASE) 50 MCG/ACT nasal spray PLACE 2 SPRAYS INTO BOTH NOSTRILS DAILY. 48 mL 3   hydrochlorothiazide (HYDRODIURIL) 25 MG tablet TAKE 1 TABLET BY MOUTH EVERY DAY 90 tablet 3   nebivolol (BYSTOLIC) 5 MG tablet TAKE 1 TABLET (5 MG TOTAL) BY MOUTH DAILY. 90 tablet 0   testosterone cypionate (DEPOTESTOSTERONE CYPIONATE) 200 MG/ML injection INJECT 0.75 MILLILITERS INTO THE MUSCLE ONCE EVERY 14 DAYS 6 mL 2   No current facility-administered medications on file prior to visit.    Allergies  Allergen Reactions   Diovan [Valsartan] Swelling   Social History   Socioeconomic History   Marital status: Married    Spouse name: Not on file   Number of children: Not on file   Years of education: Not  on file   Highest education level: Not on file  Occupational History   Not on file  Tobacco Use   Smoking status: Never   Smokeless tobacco: Current    Types: Snuff  Substance and Sexual Activity   Alcohol use: Yes    Alcohol/week: 6.0 standard drinks    Types: 6 Cans of beer per week   Drug use: No   Sexual activity: Yes    Comment: married, 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[employer]  Other Topics Concern   Not on file  Social History Narrative   Not on file   Social Determinants of Health   Financial Resource Strain: Not on file  Food Insecurity: Not on file  Transportation Needs: Not on file  Physical Activity: Not on file  Stress: Not on file  Social Connections: Not on file  Intimate Partner Violence: Not on file    Family History  Problem Relation Age of Onset   Cancer Paternal Aunt        breast   Cancer Paternal Uncle        lung   Cancer Paternal Uncle        brain   Cancer Paternal Uncle        unknown to pt      Review of Systems  All other systems reviewed and are negative.     Objective:   Physical Exam Vitals reviewed.  Constitutional:      General: He is not in acute distress.    Appearance: He is well-developed. He is not diaphoretic.  HENT:     Head: Normocephalic and atraumatic.     Right Ear: External ear normal.     Left Ear: External ear normal.     Nose: Nose normal.     Mouth/Throat:     Pharynx: No oropharyngeal exudate.  Eyes:     General: No scleral icterus.       Right eye: No discharge.        Left eye: No discharge.     Conjunctiva/sclera: Conjunctivae normal.     Pupils: Pupils are equal, round, and reactive to light.  Neck:     Thyroid: No thyromegaly.     Vascular: No JVD.     Trachea: No tracheal deviation.  Cardiovascular:     Rate and Rhythm: Normal rate and regular rhythm.     Heart sounds: Normal heart sounds. No murmur heard.   No friction rub. No gallop.  Pulmonary:     Effort: Pulmonary effort is normal. No respiratory distress.     Breath sounds: Normal breath sounds. No stridor. No wheezing or rales.  Chest:     Chest wall: No tenderness.  Abdominal:     General: Bowel sounds are normal. There is no distension.     Palpations: Abdomen is soft. There is no mass.     Tenderness: There is no abdominal tenderness. There is no guarding or rebound.  Musculoskeletal:        General: No tenderness. Normal range of motion.     Cervical back: Normal range of motion and neck supple.  Lymphadenopathy:     Cervical: No cervical adenopathy.  Skin:    General: Skin is warm.     Coloration: Skin is not pale.     Findings: No erythema or rash.  Neurological:     Mental Status: He is alert and oriented to person, place, and time.     Cranial  Nerves: No cranial nerve deficit.  Motor: No abnormal muscle tone.     Coordination: Coordination normal.     Deep Tendon Reflexes: Reflexes are normal and symmetric.  Psychiatric:        Behavior: Behavior normal.        Thought Content: Thought content normal.        Judgment: Judgment normal.          Assessment & Plan:   Benign essential HTN - Plan: CBC with Differential/Platelet, Lipid panel, COMPLETE METABOLIC PANEL WITH GFR, PSA, Testosterone Total,Free,Bio, Males  Hypogonadism in male - Plan: CBC with Differential/Platelet, Lipid panel, COMPLETE METABOLIC PANEL WITH GFR, PSA, Testosterone Total,Free,Bio, Males  Colon cancer screening - Plan: Ambulatory referral to Gastroenterology  GAD (generalized anxiety disorder) First am concerned about his blood pressure.  Of asked the patient to check his blood pressure every day and notify me of the values in 2 weeks.  If consistently elevated I would increase his Bystolic to 10 mg a day.  Meanwhile I will check CBC CMP lipid panel PSA.  The patient has been off testosterone now for over a month so we will get a baseline testosterone level.  Patient is overdue for colonoscopy so I will also schedule him for colon cancer screening.  Screen for prostate cancer with a PSA.  Try Lexapro 10 mg a day for generalized anxiety disorder.  Reassess in 6 weeks to see if the medication is helping to calm his anxiety and reduce his stress.

## 2021-02-16 ENCOUNTER — Other Ambulatory Visit: Payer: Self-pay | Admitting: Family Medicine

## 2021-02-16 DIAGNOSIS — E291 Testicular hypofunction: Secondary | ICD-10-CM

## 2021-02-16 LAB — COMPLETE METABOLIC PANEL WITH GFR
AG Ratio: 1.9 (calc) (ref 1.0–2.5)
ALT: 22 U/L (ref 9–46)
AST: 20 U/L (ref 10–35)
Albumin: 5.2 g/dL — ABNORMAL HIGH (ref 3.6–5.1)
Alkaline phosphatase (APISO): 59 U/L (ref 35–144)
BUN: 17 mg/dL (ref 7–25)
CO2: 29 mmol/L (ref 20–32)
Calcium: 10 mg/dL (ref 8.6–10.3)
Chloride: 99 mmol/L (ref 98–110)
Creat: 1.02 mg/dL (ref 0.70–1.30)
Globulin: 2.7 g/dL (calc) (ref 1.9–3.7)
Glucose, Bld: 92 mg/dL (ref 65–99)
Potassium: 4.7 mmol/L (ref 3.5–5.3)
Sodium: 138 mmol/L (ref 135–146)
Total Bilirubin: 0.8 mg/dL (ref 0.2–1.2)
Total Protein: 7.9 g/dL (ref 6.1–8.1)
eGFR: 90 mL/min/{1.73_m2} (ref >=60)

## 2021-02-16 LAB — CBC WITH DIFFERENTIAL/PLATELET
Absolute Monocytes: 775 cells/uL (ref 200–950)
Basophils Absolute: 41 cells/uL (ref 0–200)
Basophils Relative: 0.8 %
Eosinophils Absolute: 41 cells/uL (ref 15–500)
Eosinophils Relative: 0.8 %
HCT: 48.6 % (ref 38.5–50.0)
Hemoglobin: 16.9 g/dL (ref 13.2–17.1)
Lymphs Abs: 1035 cells/uL (ref 850–3900)
MCH: 32.6 pg (ref 27.0–33.0)
MCHC: 34.8 g/dL (ref 32.0–36.0)
MCV: 93.6 fL (ref 80.0–100.0)
MPV: 10.4 fL (ref 7.5–12.5)
Monocytes Relative: 15.2 %
Neutro Abs: 3208 cells/uL (ref 1500–7800)
Neutrophils Relative %: 62.9 %
Platelets: 261 10*3/uL (ref 140–400)
RBC: 5.19 10*6/uL (ref 4.20–5.80)
RDW: 12.6 % (ref 11.0–15.0)
Total Lymphocyte: 20.3 %
WBC: 5.1 10*3/uL (ref 3.8–10.8)

## 2021-02-16 LAB — LIPID PANEL
Cholesterol: 236 mg/dL — ABNORMAL HIGH (ref <200)
HDL: 126 mg/dL (ref >=40)
LDL Cholesterol (Calc): 94 mg/dL (calc)
Non-HDL Cholesterol (Calc): 110 mg/dL (calc) (ref <130)
Total CHOL/HDL Ratio: 1.9 (calc) (ref <5.0)
Triglycerides: 70 mg/dL (ref <150)

## 2021-02-16 LAB — TESTOSTERONE TOTAL,FREE,BIO, MALES
Albumin: 5.2 g/dL — ABNORMAL HIGH (ref 3.6–5.1)
Sex Hormone Binding: 36 nmol/L (ref 10–50)
Testosterone: 227 ng/dL — ABNORMAL LOW (ref 250–827)

## 2021-02-16 LAB — PSA: PSA: 0.42 ng/mL (ref <=4.00)

## 2021-02-16 MED ORDER — TESTOSTERONE CYPIONATE 200 MG/ML IM SOLN: INTRAMUSCULAR | 2 refills | Status: DC

## 2021-03-01 ENCOUNTER — Other Ambulatory Visit: Payer: Self-pay | Admitting: Family Medicine

## 2021-03-12 ENCOUNTER — Other Ambulatory Visit: Payer: Self-pay | Admitting: Family Medicine

## 2021-03-12 MED ORDER — ESCITALOPRAM OXALATE 10 MG PO TABS: 10.0000 mg | ORAL_TABLET | Freq: Every day | ORAL | 2 refills | Status: DC

## 2021-03-12 NOTE — Telephone Encounter (Signed)
Please advise 

## 2021-03-15 ENCOUNTER — Encounter: Payer: Self-pay | Admitting: Internal Medicine

## 2021-04-07 ENCOUNTER — Other Ambulatory Visit: Payer: Self-pay

## 2021-04-07 ENCOUNTER — Ambulatory Visit (AMBULATORY_SURGERY_CENTER): Payer: Self-pay | Admitting: *Deleted

## 2021-04-07 VITALS — Ht 72.0 in | Wt 205.0 lb

## 2021-04-07 DIAGNOSIS — Z1211 Encounter for screening for malignant neoplasm of colon: Secondary | ICD-10-CM

## 2021-04-07 MED ORDER — PEG 3350-KCL-NA BICARB-NACL 420 G PO SOLR: 4000.0000 mL | Freq: Once | ORAL | 0 refills | Status: AC

## 2021-04-07 NOTE — Progress Notes (Signed)
No egg or soy allergy known to patient  No issues known to pt with past sedation with any surgeries or procedures Patient denies ever being told they had issues or difficulty with intubation  No FH of Malignant Hyperthermia Pt is not on diet pills Pt is not on  home 02  Pt is not on blood thinners  Pt denies issues with constipation  No A fib or A flutter  Due to the COVID-19 pandemic we are asking patients to follow certain guidelines in PV and the [facility]   Pt aware of COVID protocols and [facility] guidelines   PV completed over the phone. Pt verified name, DOB, address and insurance during PV today.  Pt mailed instruction packet with copy of consent form to read and not return, and instructions.  Pt encouraged to call with questions or issues.

## 2021-04-20 ENCOUNTER — Encounter: Payer: Self-pay | Admitting: Internal Medicine

## 2021-04-21 ENCOUNTER — Ambulatory Visit (AMBULATORY_SURGERY_CENTER): Payer: BC Managed Care – PPO | Admitting: Internal Medicine

## 2021-04-21 ENCOUNTER — Encounter: Payer: Self-pay | Admitting: Internal Medicine

## 2021-04-21 ENCOUNTER — Other Ambulatory Visit: Payer: Self-pay

## 2021-04-21 VITALS — BP 130/76 | HR 43 | Temp 98.3°F | Resp 13 | Ht 72.0 in | Wt 207.0 lb

## 2021-04-21 DIAGNOSIS — D123 Benign neoplasm of transverse colon: Secondary | ICD-10-CM | POA: Diagnosis not present

## 2021-04-21 DIAGNOSIS — Z1211 Encounter for screening for malignant neoplasm of colon: Secondary | ICD-10-CM | POA: Diagnosis not present

## 2021-04-21 DIAGNOSIS — D120 Benign neoplasm of cecum: Secondary | ICD-10-CM

## 2021-04-21 DIAGNOSIS — D124 Benign neoplasm of descending colon: Secondary | ICD-10-CM

## 2021-04-21 DIAGNOSIS — D122 Benign neoplasm of ascending colon: Secondary | ICD-10-CM

## 2021-04-21 MED ORDER — SODIUM CHLORIDE 0.9 % IV SOLN: 500.0000 mL | Freq: Once | INTRAVENOUS | Status: DC

## 2021-04-21 NOTE — Progress Notes (Signed)
Called to room to assist during endoscopic procedure.  Patient ID and intended procedure confirmed with present staff. Received instructions for my participation in the procedure from the performing physician.  

## 2021-04-21 NOTE — Op Note (Signed)
[facility] Patient Name: Miguel Mathis Procedure Date: 04/21/2021 9:06 AM MRN: [id number] Endoscopist: Sonny Masters "Miguel Mathis ,  Age: 51 Referring MD:  Date of Birth: Aug 18, 1970 Gender: Male Account #: [id number] Procedure:                Colonoscopy Indications:              Screening for colorectal malignant neoplasm, This                            is the patient's first colonoscopy Medicines:                Monitored Anesthesia Care Procedure:                Pre-Anesthesia Assessment:                           - Prior to the procedure, a History and Physical                            was performed, and patient medications and                            allergies were reviewed. The patient's tolerance of                            previous anesthesia was also reviewed. The risks                            and benefits of the procedure and the sedation                            options and risks were discussed with the patient.                            All questions were answered, and informed consent                            was obtained. Prior Anticoagulants: The patient has                            taken no previous anticoagulant or antiplatelet                            agents. ASA Grade Assessment: II - A patient with                            mild systemic disease. After reviewing the risks                            and benefits, the patient was deemed in                            satisfactory condition to undergo the procedure.  After obtaining informed consent, the colonoscope                            was passed under direct vision. Throughout the                            procedure, the patient's blood pressure, pulse, and                            oxygen saturations were monitored continuously. The                            CF [id number] #[id number] was introduced through the anus                            and advanced to the the  terminal ileum. The                            colonoscopy was performed without difficulty. The                            patient tolerated the procedure well. The quality                            of the bowel preparation was good. The terminal                            ileum, ileocecal valve, appendiceal orifice, and                            rectum were photographed. Scope In: 9:15:06 AM Scope Out: 9:36:43 AM Scope Withdrawal Time: 0 hours 17 minutes 56 seconds  Total Procedure Duration: 0 hours 21 minutes 37 seconds  Findings:                 The terminal ileum appeared normal.                           Six sessile polyps were found in the descending                            colon, transverse colon, ascending colon and cecum.                            The polyps were 2 to 4 mm in size. These polyps                            were removed with a cold snare. Resection and                            retrieval were complete.                           Non-bleeding internal hemorrhoids were found during  retroflexion. Complications:            No immediate complications. Estimated Blood Loss:     Estimated blood loss was minimal. Impression:               - The examined portion of the ileum was normal.                           - Six 2 to 4 mm polyps in the descending colon, in                            the transverse colon, in the ascending colon and in                            the cecum, removed with a cold snare. Resected and                            retrieved.                           - Non-bleeding internal hemorrhoids. Recommendation:           - Discharge patient to home (with escort).                           - Await pathology results.                           - The findings and recommendations were discussed                            with the patient. Sonny Masters "Miguel Mathis,  04/21/2021 9:40:33 AM

## 2021-04-21 NOTE — Progress Notes (Signed)
GASTROENTEROLOGY PROCEDURE H&P NOTE   Primary Care Physician: Susy Frizzle, MD    Reason for Procedure:   Colon cancer screening  Plan:    Colonoscopy  Patient is appropriate for endoscopic procedure(s) in the ambulatory ([facility]) setting.  The nature of the procedure, as well as the risks, benefits, and alternatives were carefully and thoroughly reviewed with the patient. Ample time for discussion and questions allowed. The patient understood, was satisfied, and agreed to proceed.     HPI: Miguel Mathis is a 51 y.o. male who presents for colonoscopy for colon cancer screening. Denies blood in stools, changes in bowel habits, weight loss. Denies fam hx of colon cancer.   Past Medical History:  Diagnosis Date   Anxiety    Basal cell cancer    Hypertension    Hypogonadism in male     Past Surgical History:  Procedure Laterality Date   KNEE ARTHROSCOPY     VARICOCELE EXCISION     EARLY 30'S    Prior to Admission medications   Medication Sig Start Date End Date Taking? Authorizing Provider  hydrochlorothiazide (HYDRODIURIL) 25 MG tablet TAKE 1 TABLET BY MOUTH EVERY DAY 03/02/21  Yes Susy Frizzle, MD  MILK THISTLE PO Take 175 mg by mouth as needed.   Yes [provider]  nebivolol (BYSTOLIC) 5 MG tablet TAKE 1 TABLET (5 MG TOTAL) BY MOUTH DAILY. 02/03/21  Yes Susy Frizzle, MD  OVER THE COUNTER MEDICATION daily. ALPHA BRAIN TAKE ONE CAPSULE   Yes [provider]  escitalopram (LEXAPRO) 10 MG tablet Take 1 tablet (10 mg total) by mouth daily. Patient not taking: Reported on 04/21/2021 03/12/21   Susy Frizzle, MD  fluticasone [facility]) 50 MCG/ACT nasal spray PLACE 2 SPRAYS INTO BOTH NOSTRILS DAILY. Patient not taking: Reported on 04/07/2021 02/25/19   Susy Frizzle, MD  testosterone cypionate (DEPOTESTOSTERONE CYPIONATE) 200 MG/ML injection INJECT 0.75 MILLILITERS INTO THE MUSCLE ONCE EVERY 14 DAYS 02/16/21   Susy Frizzle, MD     Current Outpatient Medications  Medication Sig Dispense Refill   hydrochlorothiazide (HYDRODIURIL) 25 MG tablet TAKE 1 TABLET BY MOUTH EVERY DAY 90 tablet 3   MILK THISTLE PO Take 175 mg by mouth as needed.     nebivolol (BYSTOLIC) 5 MG tablet TAKE 1 TABLET (5 MG TOTAL) BY MOUTH DAILY. 90 tablet 0   OVER THE COUNTER MEDICATION daily. ALPHA BRAIN TAKE ONE CAPSULE     escitalopram (LEXAPRO) 10 MG tablet Take 1 tablet (10 mg total) by mouth daily. (Patient not taking: Reported on 04/21/2021) 90 tablet 2   fluticasone (FLONASE) 50 MCG/ACT nasal spray PLACE 2 SPRAYS INTO BOTH NOSTRILS DAILY. (Patient not taking: Reported on 04/07/2021) 48 mL 3   testosterone cypionate (DEPOTESTOSTERONE CYPIONATE) 200 MG/ML injection INJECT 0.75 MILLILITERS INTO THE MUSCLE ONCE EVERY 14 DAYS 6 mL 2   Current Facility-Administered Medications  Medication Dose Route Frequency Provider Last Rate Last Admin   0.9 %  sodium chloride infusion  500 mL Intravenous Once Sharyn Creamer, MD        Allergies as of 04/21/2021 - Review Complete 04/21/2021  Allergen Reaction Noted   Diovan [valsartan] Swelling 04/04/2016    Family History  Problem Relation Age of Onset   Cancer Paternal Aunt        breast   Cancer Paternal Uncle        lung   Cancer Paternal Uncle        brain  Cancer Paternal Uncle        unknown to pt   Colon cancer Neg Hx    Colon polyps Neg Hx    Esophageal cancer Neg Hx    Rectal cancer Neg Hx    Stomach cancer Neg Hx     Social History   Socioeconomic History   Marital status: Married    Spouse name: Not on file   Number of children: Not on file   Years of education: Not on file   Highest education level: Not on file  Occupational History   Not on file  Tobacco Use   Smoking status: Never   Smokeless tobacco: Current    Types: Snuff   Tobacco comments:    Lsast used snuff at 2200 on 04/20/21  Vaping Use   Vaping Use: Never used  Substance and Sexual Activity   Alcohol use:  Yes    Alcohol/week: 6.0 standard drinks    Types: 6 Cans of beer per week   Drug use: No   Sexual activity: Yes    Comment: married, 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[employer]  Other Topics Concern   Not on file  Social History Narrative   Not on file   Social Determinants of Health   Financial Resource Strain: Not on file  Food Insecurity: Not on file  Transportation Needs: Not on file  Physical Activity: Not on file  Stress: Not on file  Social Connections: Not on file  Intimate Partner Violence: Not on file    Physical Exam: Vital signs in last 24 hours: BP 135/82    Pulse (!) 55    Temp 98.3 F (36.8 C)    Ht 6' (1.829 m)    Wt 207 lb (93.9 kg)    SpO2 96%    BMI 28.07 kg/m  GEN: NAD EYE: Sclerae anicteric ENT: MMM CV: Non-tachycardic Pulm: No increased work of breathing GI: Soft, NT/ND NEURO:  Alert & Oriented   Christia Reading, MD [facility]  04/21/2021 8:45 AM

## 2021-04-21 NOTE — Patient Instructions (Signed)
Handouts on polyps & hemorrhoids given to you today   Await pathology results on polyps removed     YOU HAD AN ENDOSCOPIC PROCEDURE TODAY AT [facility]:   Refer to the procedure report that was given to you for any specific questions about what was found during the examination.  If the procedure report does not answer your questions, please call your gastroenterologist to clarify.  If you requested that your care partner not be given the details of your procedure findings, then the procedure report has been included in a sealed envelope for you to review at your convenience later.  YOU SHOULD EXPECT: Some feelings of bloating in the abdomen. Passage of more gas than usual.  Walking can help get rid of the air that was put into your GI tract during the procedure and reduce the bloating. If you had a lower endoscopy (such as a colonoscopy or flexible sigmoidoscopy) you may notice spotting of blood in your stool or on the toilet paper. If you underwent a bowel prep for your procedure, you may not have a normal bowel movement for a few days.  Please Note:  You might notice some irritation and congestion in your nose or some drainage.  This is from the oxygen used during your procedure.  There is no need for concern and it should clear up in a day or so.  SYMPTOMS TO REPORT IMMEDIATELY:  Following lower endoscopy (colonoscopy or flexible sigmoidoscopy):  Excessive amounts of blood in the stool  Significant tenderness or worsening of abdominal pains  Swelling of the abdomen that is new, acute  Fever of 100F or higher  Following upper endoscopy (EGD)  Vomiting of blood or coffee ground material  New chest pain or pain under the shoulder blades  Painful or persistently difficult swallowing  New shortness of breath  Fever of 100F or higher  Black, tarry-looking stools  For urgent or emergent issues, a gastroenterologist can be reached at any hour by calling [phone]  [phone]. Do not use MyChart messaging for urgent concerns.    DIET:  We do recommend a small meal at first, but then you may proceed to your regular diet.  Drink plenty of fluids but you should avoid alcoholic beverages for 24 hours.  ACTIVITY:  You should plan to take it easy for the rest of today and you should NOT DRIVE or use heavy machinery until tomorrow (because of the sedation medicines used during the test).    FOLLOW UP: Our staff will call the number listed on your records 48-72 hours following your procedure to check on you and address any questions or concerns that you may have regarding the information given to you following your procedure. If we do not reach you, we will leave a message.  We will attempt to reach you two times.  During this call, we will ask if you have developed any symptoms of COVID 19. If you develop any symptoms (ie: fever, flu-like symptoms, shortness of breath, cough etc.) before then, please call [phone].  If you test positive for Covid 19 in the 2 weeks post procedure, please call and report this information to [place].    If any biopsies were taken you will be contacted by phone or by letter within the next 1-3 weeks.  Please call us at [phone] if you have not heard about the biopsies in 3 weeks.    SIGNATURES/CONFIDENTIALITY: You and/or your care partner have signed paperwork which will  be entered into your electronic medical record.  These signatures attest to the fact that that the information above on your After Visit Summary has been reviewed and is understood.  Full responsibility of the confidentiality of this discharge information lies with you and/or your care-partner.

## 2021-04-21 NOTE — Progress Notes (Signed)
VS-CW  Pt's states no medical or surgical changes since previsit or office visit.  

## 2021-04-21 NOTE — Progress Notes (Signed)
Report given to PACU, vss 

## 2021-04-23 ENCOUNTER — Telehealth: Payer: Self-pay

## 2021-04-23 ENCOUNTER — Encounter: Payer: Self-pay | Admitting: Internal Medicine

## 2021-04-23 NOTE — Telephone Encounter (Signed)
Second post procedure follow up call, no answer 

## 2021-04-23 NOTE — Telephone Encounter (Signed)
Left message on follow up call. 

## 2021-07-02 ENCOUNTER — Telehealth: Payer: Self-pay

## 2021-07-02 DIAGNOSIS — I10 Essential (primary) hypertension: Secondary | ICD-10-CM

## 2021-07-02 MED ORDER — NEBIVOLOL HCL 5 MG PO TABS: 5.0000 mg | ORAL_TABLET | Freq: Every day | ORAL | 0 refills | Status: DC

## 2021-07-02 NOTE — Telephone Encounter (Signed)
Pharmacy faxed a refill request for ? ?nebivolol (BYSTOLIC) 5 MG tablet [[id number]]  ?  Order Details ?Dose: 5 mg Route: Oral Frequency: Daily  ?Dispense Quantity: 90 tablet Refills: 0   ?     ?Sig: TAKE 1 TABLET (5 MG TOTAL) BY MOUTH DAILY.  ?     ?Start Date: 02/03/21 End Date: --  ?Written Date: 02/03/21 Expiration Date: 02/03/22  ? ?

## 2021-07-02 NOTE — Telephone Encounter (Signed)
Rx sent to pharmacy   

## 2021-07-08 ENCOUNTER — Other Ambulatory Visit: Payer: Self-pay | Admitting: Family Medicine

## 2021-07-08 DIAGNOSIS — I10 Essential (primary) hypertension: Secondary | ICD-10-CM

## 2021-07-08 NOTE — Telephone Encounter (Signed)
Refilled 07/02/2021 #90 0 refills. ?Requested Prescriptions  ?Pending Prescriptions Disp Refills  ?? nebivolol (BYSTOLIC) 5 MG tablet [Pharmacy Med Name: NEBIVOLOL 5 MG TABLET] 90 tablet 0  ?  Sig: TAKE 1 TABLET (5 MG TOTAL) BY MOUTH DAILY.  ?  ? Cardiovascular: Beta Blockers 3 Failed - 07/08/2021 11:38 AM  ?  ?  Failed - Last Heart Rate in normal range  ?  Pulse Readings from Last 1 Encounters:  ?04/21/21 (!) 43  ?   ?  ?  Passed - Cr in normal range and within 360 days  ?  Creat  ?Date Value Ref Range Status  ?02/15/2021 1.02 0.70 - 1.30 mg/dL Final  ?   ?  ?  Passed - AST in normal range and within 360 days  ?  AST  ?Date Value Ref Range Status  ?02/15/2021 20 10 - 35 U/L Final  ?   ?  ?  Passed - ALT in normal range and within 360 days  ?  ALT  ?Date Value Ref Range Status  ?02/15/2021 22 9 - 46 U/L Final  ?   ?  ?  Passed - Last BP in normal range  ?  BP Readings from Last 1 Encounters:  ?04/21/21 130/76  ?   ?  ?  Passed - Valid encounter within last 6 months  ?  Recent Outpatient Visits   ?      ? 4 months ago Benign essential HTN  ? [facility], Cammie Mcgee, MD  ? 2 years ago Benign essential HTN  ? [facility] [place], Cammie Mcgee, MD  ? 3 years ago Benign essential HTN  ? [facility] [place], Cammie Mcgee, MD  ? 3 years ago Benign essential HTN  ? [facility] [place], Cammie Mcgee, MD  ? 4 years ago Benign essential HTN  ? [facility] [place], Cammie Mcgee, MD  ?  ?  ? ?  ?  ?  ? ?

## 2021-09-28 ENCOUNTER — Other Ambulatory Visit: Payer: Self-pay | Admitting: Family Medicine

## 2021-09-28 DIAGNOSIS — I10 Essential (primary) hypertension: Secondary | ICD-10-CM

## 2021-12-05 ENCOUNTER — Other Ambulatory Visit: Payer: Self-pay | Admitting: Family Medicine

## 2021-12-05 DIAGNOSIS — E291 Testicular hypofunction: Secondary | ICD-10-CM

## 2022-03-01 ENCOUNTER — Other Ambulatory Visit: Payer: Self-pay | Admitting: Family Medicine

## 2022-03-25 ENCOUNTER — Other Ambulatory Visit: Payer: Self-pay | Admitting: Family Medicine

## 2022-03-26 ENCOUNTER — Other Ambulatory Visit: Payer: Self-pay | Admitting: Family Medicine

## 2022-04-02 ENCOUNTER — Other Ambulatory Visit: Payer: Self-pay | Admitting: Family Medicine

## 2022-04-02 DIAGNOSIS — E291 Testicular hypofunction: Secondary | ICD-10-CM

## 2022-04-04 NOTE — Telephone Encounter (Signed)
Requested medication (s) are due for refill today - yes  Requested medication (s) are on the active medication list - yes  Future visit scheduled -no  Last refill: -12/06/21 14m  Notes to clinic: medication not assigned protocol- provider review   Requested Prescriptions  Pending Prescriptions Disp Refills   testosterone cypionate (DEPOTESTOSTERONE CYPIONATE) 200 MG/ML injection [Pharmacy Med Name: TESTOSTERONE CYP 200 MG/ML] 6 mL 0    Sig: INJECT 0.75 MILLILITERS INTO THE MUSCLE ONCE EVERY 14 DAYS     Off-Protocol Failed - 04/02/2022 12:17 PM      Failed - Medication not assigned to a protocol, review manually.      Failed - Valid encounter within last 12 months    Recent Outpatient Visits           1 year ago Benign essential HTN   BP[place]marejoPDennard Schaumann WCammie Mcgee MD   3 years ago Benign essential HTN   [place] WCammie Mcgee [place]   3 years ago Benign essential HTN   [place] WCammie Mcgee [place]   4 years ago Benign essential HTN   BH[place]PDennard Schaumann WCammie Mcgee MD   5 years ago Benign essential HTN   BF[place]y Frizzle MD                 Requested Prescriptions  Pending Prescriptions Disp Refills   testosterone cypionate (DEPOTESTOSTERONE CYPIONATE) 200 MG/ML injection [Pharmacy Med Name: TESTOSTERONE CYP 200 MG/ML] 6 mL 0    Sig: INJECT 0.75 MILLILITERS INTO THE MUSCLE ONCE EVERY 14 DAYS     Off-Protocol Failed - 04/02/2022 12:17 PM      Failed - Medication not assigned to a protocol, review manually.      Failed - Valid encounter within last 12 months    Recent Outpatient Visits           1 year ago Benign essential HTN   [place]ennard SchaumannWCammie Mcgee MD   3 years ago Benign essential HTN   [place]PDennard Schaumann WCammie Mcgee MD   3 years ago Benign essential HTN   BT[place]PDennard SchaumannWCammie Mcgee MD   4  years ago Benign essential HTN   BTom[place]PDennard SchaumannWCammie Mcgee MD   5 years ago Benign essential HTN   [place]itat[place]ard, WCammie Mcgee MD

## 2022-04-17 ENCOUNTER — Other Ambulatory Visit: Payer: Self-pay | Admitting: Family Medicine

## 2022-04-18 NOTE — Telephone Encounter (Signed)
Unable to refill per protocol, courtesy refill already given, should have enough to last to next OV 04/19/22. Will refuse.  Requested Prescriptions  Pending Prescriptions Disp Refills   hydrochlorothiazide (HYDRODIURIL) 25 MG tablet [Pharmacy Med Name: HYDROCHLOROTHIAZIDE 25 MG TAB] 90 tablet 1    Sig: TAKE 1 TABLET BY MOUTH EVERY DAY     Cardiovascular: Diuretics - Thiazide Failed - 04/17/2022  2:31 PM      Failed - Cr in normal range and within 180 days    Creat  Date Value Ref Range Status  02/15/2021 1.02 0.70 - 1.30 mg/dL Final         Failed - K in normal range and within 180 days    Potassium  Date Value Ref Range Status  02/15/2021 4.7 3.5 - 5.3 mmol/L Final         Failed - Na in normal range and within 180 days    Sodium  Date Value Ref Range Status  02/15/2021 138 135 - 146 mmol/L Final         Failed - Valid encounter within last 6 months    Recent Outpatient Visits           1 year ago Benign essential HTN   [place] Susy Frizzle, MD   3 years ago Benign essential HTN   [place] Susy Frizzle, MD   4 years ago Benign essential HTN   [place] Susy Frizzle, MD   4 years ago Benign essential HTN   [place] Susy Frizzle, MD   5 years ago Benign essential HTN   [place], Cammie Mcgee, MD       Future Appointments             Tomorrow Pickard, Cammie Mcgee, MD [facility]            Passed - Last BP in normal range    BP Readings from Last 1 Encounters:  04/21/21 130/76

## 2022-04-19 ENCOUNTER — Encounter: Payer: Self-pay | Admitting: Family Medicine

## 2022-04-19 ENCOUNTER — Other Ambulatory Visit: Payer: Self-pay | Admitting: Family Medicine

## 2022-04-19 ENCOUNTER — Ambulatory Visit: Payer: BC Managed Care – PPO | Admitting: Family Medicine

## 2022-04-19 VITALS — BP 120/80 | HR 61 | Temp 98.0°F | Ht 72.0 in | Wt 221.0 lb

## 2022-04-19 DIAGNOSIS — E291 Testicular hypofunction: Secondary | ICD-10-CM | POA: Diagnosis not present

## 2022-04-19 DIAGNOSIS — I10 Essential (primary) hypertension: Secondary | ICD-10-CM

## 2022-04-19 NOTE — Telephone Encounter (Signed)
Requested Prescriptions  Pending Prescriptions Disp Refills   hydrochlorothiazide (HYDRODIURIL) 25 MG tablet [Pharmacy Med Name: HYDROCHLOROTHIAZIDE 25 MG TAB] 90 tablet 0    Sig: TAKE 1 TABLET BY MOUTH EVERY DAY     Cardiovascular: Diuretics - Thiazide Failed - 04/19/2022  1:52 AM      Failed - Cr in normal range and within 180 days    Creat  Date Value Ref Range Status  02/15/2021 1.02 0.70 - 1.30 mg/dL Final         Failed - K in normal range and within 180 days    Potassium  Date Value Ref Range Status  02/15/2021 4.7 3.5 - 5.3 mmol/L Final         Failed - Na in normal range and within 180 days    Sodium  Date Value Ref Range Status  02/15/2021 138 135 - 146 mmol/L Final         Failed - Valid encounter within last 6 months    Recent Outpatient Visits           1 year ago Benign essential HTN   [place] Susy Frizzle, MD   3 years ago Benign essential HTN   [place] Susy Frizzle, MD   4 years ago Benign essential HTN   [place] Susy Frizzle, MD   4 years ago Benign essential HTN   [place] Susy Frizzle, MD   5 years ago Benign essential HTN   [place], Cammie Mcgee, MD              Passed - Last BP in normal range    BP Readings from Last 1 Encounters:  04/19/22 120/80

## 2022-04-19 NOTE — Progress Notes (Signed)
Subjective:    Patient ID: Miguel Mathis, male    DOB: Apr 24, 1970, 52 y.o.   MRN: [id number]  Patient is a 52 year old Caucasian male who presents today for a checkup.  He has a history of hypogonadism.  He is currently using testosterone every 2 to 4 weeks.  He admits that he is not taking it consistently however when he does take it he feels much better.  It helps his fatigue, his poor libido, and his overall mood.  He denies any redness of the skin.  He denies any chest pain or shortness of breath or dyspnea on exertion.  His blood pressure today is well-controlled. Past Medical History:  Diagnosis Date   Anxiety    Basal cell cancer    Hypertension    Hypogonadism in male    Past Surgical History:  Procedure Laterality Date   KNEE ARTHROSCOPY     VARICOCELE EXCISION     EARLY 30'S   Current Outpatient Medications on File Prior to Visit  Medication Sig Dispense Refill   hydrochlorothiazide (HYDRODIURIL) 25 MG tablet TAKE 1 TABLET BY MOUTH EVERY DAY 30 tablet 0   MILK THISTLE PO Take 175 mg by mouth as needed.     nebivolol (BYSTOLIC) 5 MG tablet TAKE 1 TABLET (5 MG TOTAL) BY MOUTH DAILY. 90 tablet 3   OVER THE COUNTER MEDICATION daily. ALPHA BRAIN TAKE ONE CAPSULE     testosterone cypionate (DEPOTESTOSTERONE CYPIONATE) 200 MG/ML injection INJECT 0.75 MILLILITERS INTO THE MUSCLE ONCE EVERY 14 DAYS 6 mL 0   No current facility-administered medications on file prior to visit.    Allergies  Allergen Reactions   Diovan [Valsartan] Swelling   Social History   Socioeconomic History   Marital status: Married    Spouse name: Not on file   Number of children: Not on file   Years of education: Not on file   Highest education level: Not on file  Occupational History   Not on file  Tobacco Use   Smoking status: Never   Smokeless tobacco: Current    Types: Snuff   Tobacco comments:    Lsast used snuff at 2200 on 04/20/21  Vaping Use   Vaping Use: Never used  Substance and  Sexual Activity   Alcohol use: Yes    Alcohol/week: 6.0 standard drinks of alcohol    Types: 6 Cans of beer per week   Drug use: No   Sexual activity: Yes    Comment: married, 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[employer]  Other Topics Concern   Not on file  Social History Narrative   Not on file   Social Determinants of Health   Financial Resource Strain: Not on file  Food Insecurity: Not on file  Transportation Needs: Not on file  Physical Activity: Not on file  Stress: Not on file  Social Connections: Not on file  Intimate Partner Violence: Not on file   Family History  Problem Relation Age of Onset   Cancer Paternal Aunt        breast   Cancer Paternal Uncle        lung   Cancer Paternal Uncle        brain   Cancer Paternal Uncle        unknown to pt   Colon cancer Neg Hx    Colon polyps Neg Hx    Esophageal cancer Neg Hx    Rectal cancer Neg Hx    Stomach cancer Neg Hx  Review of Systems  All other systems reviewed and are negative.      Objective:   Physical Exam Vitals reviewed.  Constitutional:      General: He is not in acute distress.    Appearance: He is well-developed. He is not diaphoretic.  HENT:     Head: Normocephalic and atraumatic.     Right Ear: External ear normal.     Left Ear: External ear normal.     Nose: Nose normal.     Mouth/Throat:     Pharynx: No oropharyngeal exudate.  Eyes:     General: No scleral icterus.       Right eye: No discharge.        Left eye: No discharge.     Conjunctiva/sclera: Conjunctivae normal.     Pupils: Pupils are equal, round, and reactive to light.  Neck:     Thyroid: No thyromegaly.     Vascular: No JVD.     Trachea: No tracheal deviation.  Cardiovascular:     Rate and Rhythm: Normal rate and regular rhythm.     Heart sounds: Normal heart sounds. No murmur heard.    No friction rub. No gallop.  Pulmonary:     Effort: Pulmonary effort is normal. No respiratory distress.     Breath sounds: Normal breath  sounds. No stridor. No wheezing or rales.  Chest:     Chest wall: No tenderness.  Abdominal:     General: Bowel sounds are normal. There is no distension.     Palpations: Abdomen is soft. There is no mass.     Tenderness: There is no abdominal tenderness. There is no guarding or rebound.  Musculoskeletal:        General: No tenderness. Normal range of motion.     Cervical back: Normal range of motion and neck supple.  Lymphadenopathy:     Cervical: No cervical adenopathy.  Skin:    General: Skin is warm.     Coloration: Skin is not pale.     Findings: No erythema or rash.  Neurological:     Mental Status: He is alert and oriented to person, place, and time.     Cranial Nerves: No cranial nerve deficit.     Motor: No abnormal muscle tone.     Coordination: Coordination normal.     Deep Tendon Reflexes: Reflexes are normal and symmetric.  Psychiatric:        Behavior: Behavior normal.        Thought Content: Thought content normal.        Judgment: Judgment normal.           Assessment & Plan:   Hypogonadism in male - Plan: CBC with Differential/Platelet, Lipid panel, COMPLETE METABOLIC PANEL WITH GFR, PSA  Primary hypertension - Plan: CBC with Differential/Platelet, Lipid panel, COMPLETE METABOLIC PANEL WITH GFR I am very happy with his blood pressure.  Check a CBC a CMP and a lipid panel.  Goal LDL cholesterol is less than 100.  Also check a CBC to rule out polycythemia secondary to testosterone replacement.  Check a PSA to evaluate for any evidence of prostate growth while taking testosterone.  As long as labs are stable no changes are necessary at this time

## 2022-04-20 LAB — COMPLETE METABOLIC PANEL WITH GFR
AG Ratio: 1.6 (calc) (ref 1.0–2.5)
ALT: 23 U/L (ref 9–46)
AST: 18 U/L (ref 10–35)
Albumin: 5.1 g/dL (ref 3.6–5.1)
Alkaline phosphatase (APISO): 68 U/L (ref 35–144)
BUN: 16 mg/dL (ref 7–25)
CO2: 25 mmol/L (ref 20–32)
Calcium: 10.1 mg/dL (ref 8.6–10.3)
Chloride: 95 mmol/L — ABNORMAL LOW (ref 98–110)
Creat: 1.01 mg/dL (ref 0.70–1.30)
Globulin: 3.1 g/dL (calc) (ref 1.9–3.7)
Glucose, Bld: 83 mg/dL (ref 65–99)
Potassium: 4.2 mmol/L (ref 3.5–5.3)
Sodium: 136 mmol/L (ref 135–146)
Total Bilirubin: 0.5 mg/dL (ref 0.2–1.2)
Total Protein: 8.2 g/dL — ABNORMAL HIGH (ref 6.1–8.1)
eGFR: 89 mL/min/{1.73_m2} (ref >=60)

## 2022-04-20 LAB — LIPID PANEL
Cholesterol: 230 mg/dL — ABNORMAL HIGH (ref <200)
HDL: 113 mg/dL (ref >=40)
LDL Cholesterol (Calc): 93 mg/dL (calc)
Non-HDL Cholesterol (Calc): 117 mg/dL (calc) (ref <130)
Total CHOL/HDL Ratio: 2 (calc) (ref <5.0)
Triglycerides: 138 mg/dL (ref <150)

## 2022-04-20 LAB — CBC WITH DIFFERENTIAL/PLATELET
Absolute Monocytes: 1134 cells/uL — ABNORMAL HIGH (ref 200–950)
Basophils Absolute: 32 cells/uL (ref 0–200)
Basophils Relative: 0.4 %
Eosinophils Absolute: 16 cells/uL (ref 15–500)
Eosinophils Relative: 0.2 %
HCT: 46.1 % (ref 38.5–50.0)
Hemoglobin: 16.5 g/dL (ref 13.2–17.1)
Lymphs Abs: 1725 cells/uL (ref 850–3900)
MCH: 33.1 pg — ABNORMAL HIGH (ref 27.0–33.0)
MCHC: 35.8 g/dL (ref 32.0–36.0)
MCV: 92.4 fL (ref 80.0–100.0)
MPV: 10.7 fL (ref 7.5–12.5)
Monocytes Relative: 14 %
Neutro Abs: 5192 cells/uL (ref 1500–7800)
Neutrophils Relative %: 64.1 %
Platelets: 283 10*3/uL (ref 140–400)
RBC: 4.99 10*6/uL (ref 4.20–5.80)
RDW: 12.9 % (ref 11.0–15.0)
Total Lymphocyte: 21.3 %
WBC: 8.1 10*3/uL (ref 3.8–10.8)

## 2022-04-20 LAB — PSA: PSA: 0.36 ng/mL (ref <=4.00)

## 2022-04-21 ENCOUNTER — Ambulatory Visit: Payer: BC Managed Care – PPO | Admitting: Family Medicine

## 2022-04-21 ENCOUNTER — Other Ambulatory Visit: Payer: Self-pay

## 2022-04-21 DIAGNOSIS — E291 Testicular hypofunction: Secondary | ICD-10-CM

## 2022-04-22 MED ORDER — TESTOSTERONE CYPIONATE 200 MG/ML IM SOLN: 150.0000 mg | INTRAMUSCULAR | 0 refills | Status: DC

## 2022-07-17 ENCOUNTER — Other Ambulatory Visit: Payer: Self-pay | Admitting: Family Medicine

## 2022-07-17 DIAGNOSIS — I10 Essential (primary) hypertension: Secondary | ICD-10-CM

## 2022-07-18 NOTE — Telephone Encounter (Signed)
OV 04/19/22 Rx nebivolol 09/28/21 #90 3RF- too soon Requested Prescriptions  Pending Prescriptions Disp Refills   hydrochlorothiazide (HYDRODIURIL) 25 MG tablet [Pharmacy Med Name: HYDROCHLOROTHIAZIDE 25 MG TAB] 90 tablet 0    Sig: TAKE 1 TABLET BY MOUTH EVERY DAY     Cardiovascular: Diuretics - Thiazide Failed - 07/17/2022  9:21 AM      Failed - Valid encounter within last 6 months    Recent Outpatient Visits           1 year ago Benign essential HTN   [facility] Donita Brooks, MD   3 years ago Benign essential HTN   [facility] Donita Brooks, MD   4 years ago Benign essential HTN   [facility] Tanya Nones, Priscille Heidelberg, MD   4 years ago Benign essential HTN   [facility] Tanya Nones, Priscille Heidelberg, MD   5 years ago Benign essential HTN   [facility] Pickard, Priscille Heidelberg, MD              Passed - Cr in normal range and within 180 days    Creat  Date Value Ref Range Status  04/19/2022 1.01 0.70 - 1.30 mg/dL Final         Passed - K in normal range and within 180 days    Potassium  Date Value Ref Range Status  04/19/2022 4.2 3.5 - 5.3 mmol/L Final         Passed - Na in normal range and within 180 days    Sodium  Date Value Ref Range Status  04/19/2022 136 135 - 146 mmol/L Final         Passed - Last BP in normal range    BP Readings from Last 1 Encounters:  04/19/22 120/80          nebivolol (BYSTOLIC) 5 MG tablet [Pharmacy Med Name: NEBIVOLOL 5 MG TABLET] 90 tablet 3    Sig: TAKE 1 TABLET (5 MG TOTAL) BY MOUTH DAILY.     Cardiovascular: Beta Blockers 3 Failed - 07/17/2022  9:21 AM      Failed - Valid encounter within last 6 months    Recent Outpatient Visits           1 year ago Benign essential HTN   [facility] Tanya Nones, Priscille Heidelberg, MD   3 years ago Benign essential HTN   [facility] Tanya Nones, Priscille Heidelberg, MD   4 years ago Benign essential HTN   [facility] Tanya Nones, Priscille Heidelberg, MD   4 years ago Benign essential HTN   [facility] Donita Brooks, MD   5 years ago Benign essential HTN   [facility] Pickard, Priscille Heidelberg, MD              Passed - Cr in normal range and within 360 days    Creat  Date Value Ref Range Status  04/19/2022 1.01 0.70 - 1.30 mg/dL Final         Passed - AST in normal range and within 360 days    AST  Date Value Ref Range Status  04/19/2022 18 10 - 35 U/L Final         Passed - ALT in normal range and within 360 days    ALT  Date Value Ref Range Status  04/19/2022 23 9 - 46 U/L Final  Passed - Last BP in normal range    BP Readings from Last 1 Encounters:  04/19/22 120/80         Passed - Last Heart Rate in normal range    Pulse Readings from Last 1 Encounters:  04/19/22 61

## 2022-07-21 ENCOUNTER — Telehealth: Payer: Self-pay

## 2022-07-21 ENCOUNTER — Other Ambulatory Visit: Payer: Self-pay

## 2022-07-21 DIAGNOSIS — I10 Essential (primary) hypertension: Secondary | ICD-10-CM

## 2022-07-21 MED ORDER — NEBIVOLOL HCL 5 MG PO TABS
5.0000 mg | ORAL_TABLET | Freq: Every day | ORAL | 1 refills | Status: DC
Start: 2022-07-21 — End: 2023-01-16

## 2022-07-21 NOTE — Telephone Encounter (Signed)
Pt called in to request a refill of this med; Prescription Request  07/21/2022  LOV: 04/19/22  What is the name of the medication or equipment? nebivolol (BYSTOLIC) 5 MG tablet [[id number]]  Have you contacted your pharmacy to request a refill? Yes   Which pharmacy would you like this sent to? [id number] Judithann Sheen, [place] - [address] [place] [address] Phone: [phone]  Fax: [phone]-    Patient notified that their request is being sent to the clinical staff for review and that they should receive a response within 2 business days.   Please advise at [facility] [phone]

## 2022-08-15 ENCOUNTER — Other Ambulatory Visit: Payer: Self-pay | Admitting: Family Medicine

## 2022-08-15 DIAGNOSIS — E291 Testicular hypofunction: Secondary | ICD-10-CM

## 2022-10-17 ENCOUNTER — Other Ambulatory Visit: Payer: Self-pay | Admitting: Family Medicine

## 2022-10-18 NOTE — Telephone Encounter (Signed)
Courtesy refill. Patient will need an office visit for further refills. Requested Prescriptions  Pending Prescriptions Disp Refills   hydrochlorothiazide (HYDRODIURIL) 25 MG tablet [Pharmacy Med Name: HYDROCHLOROTHIAZIDE 25 MG TAB] 30 tablet 0    Sig: TAKE 1 TABLET BY MOUTH EVERY DAY     Cardiovascular: Diuretics - Thiazide Failed - 10/17/2022  2:39 AM      Failed - Cr in normal range and within 180 days    Creat  Date Value Ref Range Status  04/19/2022 1.01 0.70 - 1.30 mg/dL Final         Failed - K in normal range and within 180 days    Potassium  Date Value Ref Range Status  04/19/2022 4.2 3.5 - 5.3 mmol/L Final         Failed - Na in normal range and within 180 days    Sodium  Date Value Ref Range Status  04/19/2022 136 135 - 146 mmol/L Final         Failed - Valid encounter within last 6 months    Recent Outpatient Visits           1 year ago Benign essential HTN   [facility] Donita Brooks, MD   3 years ago Benign essential HTN   [facility] Donita Brooks, MD   4 years ago Benign essential HTN   [facility] Donita Brooks, MD   5 years ago Benign essential HTN   [facility] Donita Brooks, MD   6 years ago Benign essential HTN   [facility] Pickard, Priscille Heidelberg, MD              Passed - Last BP in normal range    BP Readings from Last 1 Encounters:  04/19/22 120/80

## 2022-10-18 NOTE — Telephone Encounter (Signed)
Called pt - left message to callback and schedule OV.

## 2022-10-27 ENCOUNTER — Other Ambulatory Visit: Payer: Self-pay | Admitting: Family Medicine

## 2022-10-27 DIAGNOSIS — E291 Testicular hypofunction: Secondary | ICD-10-CM

## 2022-10-27 NOTE — Telephone Encounter (Signed)
Requested medication (s) are due for refill today: routing for review  Requested medication (s) are on the active medication list: yes  Last refill:  08/15/22  Future visit scheduled: yes  Notes to clinic:   Medication not assigned to a protocol, review manually.      Requested Prescriptions  Pending Prescriptions Disp Refills   testosterone cypionate (DEPOTESTOSTERONE CYPIONATE) 200 MG/ML injection [Pharmacy Med Name: TESTOSTERONE CYP 200 MG/ML] 3 mL 1    Sig: INJECT 0.75 MLS (150 MG TOTAL) INTO THE MUSCLE EVERY 14 (FOURTEEN) DAYS.     Off-Protocol Failed - 10/27/2022  1:27 AM      Failed - Medication not assigned to a protocol, review manually.      Failed - Valid encounter within last 12 months    Recent Outpatient Visits           1 year ago Benign essential HTN   [facility] Tanya Nones Priscille Heidelberg, MD   3 years ago Benign essential HTN   [facility] Tanya Nones, Priscille Heidelberg, MD   4 years ago Benign essential HTN   [facility] Tanya Nones Priscille Heidelberg, MD   5 years ago Benign essential HTN   [facility] Tanya Nones Priscille Heidelberg, MD   6 years ago Benign essential HTN   [facility] Pickard, Priscille Heidelberg, MD

## 2022-11-12 ENCOUNTER — Other Ambulatory Visit: Payer: Self-pay | Admitting: Family Medicine

## 2022-11-14 NOTE — Telephone Encounter (Signed)
Requested medications are due for refill today.  yes  Requested medications are on the active medications list.  yes  Last refill. 10/18/2022 #30 0 rf  Future visit scheduled.   no  Notes to clinic.   Pt is more than 3 months overdue for OV.    Requested Prescriptions  Pending Prescriptions Disp Refills   hydrochlorothiazide (HYDRODIURIL) 25 MG tablet [Pharmacy Med Name: HYDROCHLOROTHIAZIDE 25 MG TAB] 30 tablet 0    Sig: TAKE 1 TABLET BY MOUTH EVERY DAY     Cardiovascular: Diuretics - Thiazide Failed - 11/12/2022  4:41 PM      Failed - Cr in normal range and within 180 days    Creat  Date Value Ref Range Status  04/19/2022 1.01 0.70 - 1.30 mg/dL Final         Failed - K in normal range and within 180 days    Potassium  Date Value Ref Range Status  04/19/2022 4.2 3.5 - 5.3 mmol/L Final         Failed - Na in normal range and within 180 days    Sodium  Date Value Ref Range Status  04/19/2022 136 135 - 146 mmol/L Final         Failed - Valid encounter within last 6 months    Recent Outpatient Visits           1 year ago Benign essential HTN   [facility] Donita Brooks, MD   3 years ago Benign essential HTN   [facility] Donita Brooks, MD   4 years ago Benign essential HTN   [facility] Donita Brooks, MD   5 years ago Benign essential HTN   [facility] Donita Brooks, MD   6 years ago Benign essential HTN   [facility] Pickard, Priscille Heidelberg, MD              Passed - Last BP in normal range    BP Readings from Last 1 Encounters:  04/19/22 120/80

## 2022-11-28 ENCOUNTER — Encounter: Payer: Self-pay | Admitting: Family Medicine

## 2022-11-28 ENCOUNTER — Ambulatory Visit: Payer: BC Managed Care – PPO | Admitting: Family Medicine

## 2022-11-28 VITALS — BP 146/82 | HR 72 | Temp 98.2°F | Ht 72.0 in | Wt 228.0 lb

## 2022-11-28 DIAGNOSIS — I10 Essential (primary) hypertension: Secondary | ICD-10-CM | POA: Diagnosis not present

## 2022-11-28 DIAGNOSIS — E291 Testicular hypofunction: Secondary | ICD-10-CM

## 2022-11-28 MED ORDER — HYDROCHLOROTHIAZIDE 25 MG PO TABS: 25.0000 mg | ORAL_TABLET | Freq: Every day | ORAL | 3 refills | Status: DC

## 2022-11-28 NOTE — Progress Notes (Signed)
Subjective:    Patient ID: Miguel Mathis, male    DOB: [birth year], 52 y.o.   MRN: [id number]  Patient is a 52 year old Caucasian male who presents today for a checkup.  He has a history of hypogonadism.  He is currently using testosterone every 2 to 4 weeks.  He admits that he is not taking it consistently however when he does take it he feels much better.  I patient also has hypertension.  He stopped taking the Bystolic.  He is only taking hydrochlorothiazide.  His blood pressure today is borderline.  He has not been checking it at home consistently.  He states at times his systolic blood pressure will be 135.  At other times will be higher than 150.  Stress seems to be a big influence on his blood pressure.  Colonoscopy is up-to-date Past Medical History:  Diagnosis Date   Anxiety    Basal cell cancer    Hypertension    Hypogonadism in male    Past Surgical History:  Procedure Laterality Date   KNEE ARTHROSCOPY     VARICOCELE EXCISION     EARLY 30'S   Current Outpatient Medications on File Prior to Visit  Medication Sig Dispense Refill   hydrochlorothiazide (HYDRODIURIL) 25 MG tablet TAKE 1 TABLET BY MOUTH EVERY DAY 30 tablet 0   MILK THISTLE PO Take 175 mg by mouth as needed.     nebivolol (BYSTOLIC) 5 MG tablet Take 1 tablet (5 mg total) by mouth daily. 90 tablet 1   OVER THE COUNTER MEDICATION daily. ALPHA BRAIN TAKE ONE CAPSULE     testosterone cypionate (DEPOTESTOSTERONE CYPIONATE) 200 MG/ML injection INJECT 0.75 MLS (150 MG TOTAL) INTO THE MUSCLE EVERY 14 (FOURTEEN) DAYS. 3 mL 1   No current facility-administered medications on file prior to visit.    Allergies  Allergen Reactions   Diovan [Valsartan] Swelling   Social History   Socioeconomic History   Marital status: Married    Spouse name: Not on file   Number of children: Not on file   Years of education: Not on file   Highest education level: Some college, no degree  Occupational History   Not on file  Tobacco  Use   Smoking status: Never   Smokeless tobacco: Current    Types: Snuff   Tobacco comments:    Lsast used snuff at 2200 on 04/20/21  Vaping Use   Vaping status: Never Used  Substance and Sexual Activity   Alcohol use: Yes    Alcohol/week: 6.0 standard drinks of alcohol    Types: 6 Cans of beer per week   Drug use: No   Sexual activity: Yes    Comment: married, 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[employer]  Other Topics Concern   Not on file  Social History Narrative   Not on file   Social Determinants of Health   Financial Resource Strain: Low Risk  (11/27/2022)   Overall Financial Resource Strain (CARDIA)    Difficulty of Paying Living Expenses: Not hard at all  Food Insecurity: No Food Insecurity (11/27/2022)   Hunger Vital Sign    Worried About Running Out of Food in the Last Year: Never true    Ran Out of Food in the Last Year: Never true  Transportation Needs: No Transportation Needs (11/27/2022)   PRAPARE - Administrator, Civil Service (Medical): No    Lack of Transportation (Non-Medical): No  Physical Activity: Unknown (11/27/2022)   Exercise Vital Sign    Days  of Exercise per Week: 0 days    Minutes of Exercise per Session: Not on file  Stress: No Stress Concern Present (11/27/2022)   [employer] of Occupational Health - Occupational Stress Questionnaire    Feeling of Stress : Only a little  Social Connections: Unknown (11/27/2022)   Social Connection and Isolation Panel [NHANES]    Frequency of Communication with Friends and Family: Once a week    Frequency of Social Gatherings with Friends and Family: Twice a week    Attends Religious Services: Patient declined    Database administrator or Organizations: No    Attends Engineer, structural: Not on file    Marital Status: Married  Catering manager Violence: Not on file   Family History  Problem Relation Age of Onset   Cancer Paternal Aunt        breast   Cancer Paternal Uncle        lung   Cancer Paternal  Uncle        brain   Cancer Paternal Uncle        unknown to pt   Colon cancer Neg Hx    Colon polyps Neg Hx    Esophageal cancer Neg Hx    Rectal cancer Neg Hx    Stomach cancer Neg Hx       Review of Systems  All other systems reviewed and are negative.      Objective:   Physical Exam Vitals reviewed.  Constitutional:      General: He is not in acute distress.    Appearance: He is well-developed. He is not diaphoretic.  HENT:     Head: Normocephalic and atraumatic.     Right Ear: External ear normal.     Left Ear: External ear normal.     Nose: Nose normal.     Mouth/Throat:     Pharynx: No oropharyngeal exudate.  Eyes:     General: No scleral icterus.       Right eye: No discharge.        Left eye: No discharge.     Conjunctiva/sclera: Conjunctivae normal.     Pupils: Pupils are equal, round, and reactive to light.  Neck:     Thyroid: No thyromegaly.     Vascular: No JVD.     Trachea: No tracheal deviation.  Cardiovascular:     Rate and Rhythm: Normal rate and regular rhythm.     Heart sounds: Normal heart sounds. No murmur heard.    No friction rub. No gallop.  Pulmonary:     Effort: Pulmonary effort is normal. No respiratory distress.     Breath sounds: Normal breath sounds. No stridor. No wheezing or rales.  Chest:     Chest wall: No tenderness.  Abdominal:     General: Bowel sounds are normal. There is no distension.     Palpations: Abdomen is soft. There is no mass.     Tenderness: There is no abdominal tenderness. There is no guarding or rebound.  Musculoskeletal:        General: No tenderness. Normal range of motion.     Cervical back: Normal range of motion and neck supple.  Lymphadenopathy:     Cervical: No cervical adenopathy.  Skin:    General: Skin is warm.     Coloration: Skin is not pale.     Findings: No erythema or rash.  Neurological:     Mental Status: He is alert and oriented to person,  place, and time.     Cranial Nerves: No  cranial nerve deficit.     Motor: No abnormal muscle tone.     Coordination: Coordination normal.     Deep Tendon Reflexes: Reflexes are normal and symmetric.  Psychiatric:        Behavior: Behavior normal.        Thought Content: Thought content normal.        Judgment: Judgment normal.           Assessment & Plan:   Hypogonadism in male - Plan: CBC with Differential/Platelet, COMPLETE METABOLIC PANEL WITH GFR, Lipid panel  Benign essential HTN - Plan: CBC with Differential/Platelet, COMPLETE METABOLIC PANEL WITH GFR, Lipid panel, PSA Patient's blood pressure today is too high.  I recommend that he start checking his blood pressure every day.  If consistently greater than 140/90 I will resume Bystolic.  Check CBC CMP lipid panel and PSA.  Recommended checking lab work every 6 months while he is on testosterone replacement.  Colonoscopy is up-to-date

## 2022-11-29 LAB — CBC WITH DIFFERENTIAL/PLATELET
Absolute Monocytes: 1004 cells/uL — ABNORMAL HIGH (ref 200–950)
Basophils Absolute: 32 cells/uL (ref 0–200)
Basophils Relative: 0.6 %
Eosinophils Absolute: 38 cells/uL (ref 15–500)
Eosinophils Relative: 0.7 %
HCT: 51.7 % — ABNORMAL HIGH (ref 38.5–50.0)
Hemoglobin: 17.7 g/dL — ABNORMAL HIGH (ref 13.2–17.1)
Lymphs Abs: 1409 cells/uL (ref 850–3900)
MCH: 33 pg (ref 27.0–33.0)
MCHC: 34.2 g/dL (ref 32.0–36.0)
MCV: 96.3 fL (ref 80.0–100.0)
MPV: 10.7 fL (ref 7.5–12.5)
Monocytes Relative: 18.6 %
Neutro Abs: 2916 cells/uL (ref 1500–7800)
Neutrophils Relative %: 54 %
Platelets: 238 10*3/uL (ref 140–400)
RBC: 5.37 10*6/uL (ref 4.20–5.80)
RDW: 13 % (ref 11.0–15.0)
Total Lymphocyte: 26.1 %
WBC: 5.4 10*3/uL (ref 3.8–10.8)

## 2022-11-29 LAB — LIPID PANEL
Cholesterol: 230 mg/dL — ABNORMAL HIGH (ref <200)
HDL: 107 mg/dL (ref >=40)
LDL Cholesterol (Calc): 103 mg/dL (calc) — ABNORMAL HIGH
Non-HDL Cholesterol (Calc): 123 mg/dL (calc) (ref <130)
Total CHOL/HDL Ratio: 2.1 (calc) (ref <5.0)
Triglycerides: 108 mg/dL (ref <150)

## 2022-11-29 LAB — COMPLETE METABOLIC PANEL WITH GFR
AG Ratio: 1.8 (calc) (ref 1.0–2.5)
ALT: 33 U/L (ref 9–46)
AST: 22 U/L (ref 10–35)
Albumin: 5 g/dL (ref 3.6–5.1)
Alkaline phosphatase (APISO): 69 U/L (ref 35–144)
BUN: 14 mg/dL (ref 7–25)
CO2: 23 mmol/L (ref 20–32)
Calcium: 9.7 mg/dL (ref 8.6–10.3)
Chloride: 101 mmol/L (ref 98–110)
Creat: 0.94 mg/dL (ref 0.70–1.30)
Globulin: 2.8 g/dL (calc) (ref 1.9–3.7)
Glucose, Bld: 73 mg/dL (ref 65–99)
Potassium: 4.3 mmol/L (ref 3.5–5.3)
Sodium: 138 mmol/L (ref 135–146)
Total Bilirubin: 0.9 mg/dL (ref 0.2–1.2)
Total Protein: 7.8 g/dL (ref 6.1–8.1)
eGFR: 98 mL/min/{1.73_m2} (ref >=60)

## 2022-11-29 LAB — PSA: PSA: 0.39 ng/mL (ref <=4.00)

## 2023-01-14 ENCOUNTER — Other Ambulatory Visit: Payer: Self-pay | Admitting: Family Medicine

## 2023-01-14 DIAGNOSIS — I10 Essential (primary) hypertension: Secondary | ICD-10-CM

## 2023-01-16 NOTE — Telephone Encounter (Signed)
Requested Prescriptions  Pending Prescriptions Disp Refills   nebivolol (BYSTOLIC) 5 MG tablet [Pharmacy Med Name: NEBIVOLOL 5 MG TABLET] 90 tablet 1    Sig: TAKE 1 TABLET (5 MG TOTAL) BY MOUTH DAILY.     Cardiovascular: Beta Blockers 3 Failed - 01/14/2023  8:50 AM      Failed - Last BP in normal range    BP Readings from Last 1 Encounters:  11/28/22 (!) 146/82         Failed - Valid encounter within last 6 months    Recent Outpatient Visits           1 year ago Benign essential HTN   [facility] Donita Brooks, MD   3 years ago Benign essential HTN   [facility] Donita Brooks, MD   4 years ago Benign essential HTN   [facility] Tanya Nones, Priscille Heidelberg, MD   5 years ago Benign essential HTN   [facility] Donita Brooks, MD   6 years ago Benign essential HTN   [facility] Pickard, Priscille Heidelberg, MD              Passed - Cr in normal range and within 360 days    Creat  Date Value Ref Range Status  11/28/2022 0.94 0.70 - 1.30 mg/dL Final         Passed - AST in normal range and within 360 days    AST  Date Value Ref Range Status  11/28/2022 22 10 - 35 U/L Final         Passed - ALT in normal range and within 360 days    ALT  Date Value Ref Range Status  11/28/2022 33 9 - 46 U/L Final         Passed - Last Heart Rate in normal range    Pulse Readings from Last 1 Encounters:  11/28/22 72

## 2023-07-12 ENCOUNTER — Other Ambulatory Visit: Payer: Self-pay | Admitting: Family Medicine

## 2023-07-12 DIAGNOSIS — I10 Essential (primary) hypertension: Secondary | ICD-10-CM

## 2023-07-12 NOTE — Telephone Encounter (Signed)
 Prescription Request  07/12/2023  LOV: 11/28/2022  What is the name of the medication or equipment? nebivolol  (BYSTOLIC ) 5 MG tablet   Have you contacted your pharmacy to request a refill? Yes   Which pharmacy would you like this sent to?  [id number], [place] - [address] [place] [address] Phone: [phone] Fax: [phone]    Patient notified that their request is being sent to the clinical staff for review and that they should receive a response within 2 business days.   Please advise at [facility] [phone]

## 2023-07-14 MED ORDER — NEBIVOLOL HCL 5 MG PO TABS: 5.0000 mg | ORAL_TABLET | Freq: Every day | ORAL | 0 refills | Status: DC

## 2023-07-14 NOTE — Telephone Encounter (Signed)
 Requested Prescriptions  Pending Prescriptions Disp Refills   nebivolol  (BYSTOLIC ) 5 MG tablet 30 tablet 0    Sig: Take 1 tablet (5 mg total) by mouth daily.     Cardiovascular: Beta Blockers 3 Failed - 07/14/2023 12:21 PM      Failed - Last BP in normal range    BP Readings from Last 1 Encounters:  11/28/22 (!) 146/82         Failed - Valid encounter within last 6 months    Recent Outpatient Visits           7 months ago Hypogonadism in male   [place] [facility] Pickard, Cisco Crest, MD   1 year ago Hypogonadism in male   [facility] Pickard, Cisco Crest, MD              Passed - Cr in normal range and within 360 days    Creat  Date Value Ref Range Status  11/28/2022 0.94 0.70 - 1.30 mg/dL Final         Passed - AST in normal range and within 360 days    AST  Date Value Ref Range Status  11/28/2022 22 10 - 35 U/L Final         Passed - ALT in normal range and within 360 days    ALT  Date Value Ref Range Status  11/28/2022 33 9 - 46 U/L Final         Passed - Last Heart Rate in normal range    Pulse Readings from Last 1 Encounters:  11/28/22 72

## 2023-07-17 ENCOUNTER — Telehealth: Payer: Self-pay | Admitting: Family Medicine

## 2023-07-17 DIAGNOSIS — E291 Testicular hypofunction: Secondary | ICD-10-CM

## 2023-07-17 NOTE — Telephone Encounter (Unsigned)
 Copied from CRM [id number]. Topic: Clinical - Medication Refill >> Jul 17, 2023  2:15 PM Rachelle R wrote: Medication: testosterone  cypionate (DEPOTESTOSTERONE CYPIONATE) 200 MG/ML injection  Has the patient contacted their pharmacy? Yes, advised to call office.  This is the patient's preferred pharmacy:  [phone], [place] - [address] [place] [address] Phone: [phone] Fax: [phone]  Is this the correct pharmacy for this prescription? Yes  Has the prescription been filled recently? No  Is the patient out of the medication? Yes  Has the patient been seen for an appointment in the last year OR does the patient have an upcoming appointment? Yes  Can we respond through MyChart? No, call [phone]  Agent: Please be advised that Rx refills may take up to 3 business days. We ask that you follow-up with your pharmacy.

## 2023-07-18 ENCOUNTER — Other Ambulatory Visit: Payer: Self-pay

## 2023-07-18 ENCOUNTER — Telehealth: Payer: Self-pay

## 2023-07-18 DIAGNOSIS — I10 Essential (primary) hypertension: Secondary | ICD-10-CM

## 2023-07-18 MED ORDER — NEBIVOLOL HCL 5 MG PO TABS
5.0000 mg | ORAL_TABLET | Freq: Every day | ORAL | 0 refills | Status: DC
Start: 2023-07-18 — End: 2023-08-28

## 2023-07-18 NOTE — Telephone Encounter (Signed)
 Copied from CRM [id number]. Topic: Clinical - Prescription Issue >> Jul 18, 2023  8:24 AM Elle L wrote: Reason for CRM: The patient states his pharmacy did not not receive his nebivolol  (BYSTOLIC ) 5 MG tablet prescription refill from 5/9. He is currently out of the medication. The patient's call back number is [phone].

## 2023-07-19 NOTE — Telephone Encounter (Signed)
 Requested medication (s) are due for refill today -yes  Requested medication (s) are on the active medication list -yes  Future visit scheduled -no  Last refill: 10/31/22 3ml 1RF  Notes to clinic: off protocol- provider review   Requested Prescriptions  Pending Prescriptions Disp Refills   testosterone  cypionate (DEPOTESTOSTERONE CYPIONATE) 200 MG/ML injection 3 mL 1    Sig: Inject 0.75 mLs (150 mg total) into the muscle every 14 (fourteen) days.     Off-Protocol Failed - 07/19/2023  8:43 AM      Failed - Medication not assigned to a protocol, review manually.      Passed - Valid encounter within last 12 months    Recent Outpatient Visits           7 months ago Hypogonadism in male   [facility] Pickard, Cisco Crest, MD   1 year ago Hypogonadism in male   [place] [facility] Pickard, Cisco Crest, MD                 Requested Prescriptions  Pending Prescriptions Disp Refills   testosterone  cypionate (DEPOTESTOSTERONE CYPIONATE) 200 MG/ML injection 3 mL 1    Sig: Inject 0.75 mLs (150 mg total) into the muscle every 14 (fourteen) days.     Off-Protocol Failed - 07/19/2023  8:43 AM      Failed - Medication not assigned to a protocol, review manually.      Passed - Valid encounter within last 12 months    Recent Outpatient Visits           7 months ago Hypogonadism in male   [facility] Pickard, Cisco Crest, MD   1 year ago Hypogonadism in male   [place] [facility] Pickard, Cisco Crest, MD

## 2023-07-20 MED ORDER — TESTOSTERONE CYPIONATE 200 MG/ML IM SOLN
150.0000 mg | INTRAMUSCULAR | 1 refills | Status: DC
Start: 2023-07-20 — End: 2023-08-28

## 2023-08-16 ENCOUNTER — Other Ambulatory Visit: Payer: Self-pay | Admitting: Family Medicine

## 2023-08-16 NOTE — Telephone Encounter (Signed)
 Copied from CRM [id number]. Topic: Clinical - Medication Refill >> Aug 16, 2023 10:46 AM Alysia Jumbo S wrote: Medication: hydrochlorothiazide  (HYDRODIURIL ) 25 MG tablet  Has the patient contacted their pharmacy? No (Agent: If no, request that the patient contact the pharmacy for the refill. If patient does not wish to contact the pharmacy document the reason why and proceed with request.) (Agent: If yes, when and what did the pharmacy advise?)  This is the patient's preferred pharmacy:  [phone] [place] - [address] [place] [address] Phone: [phone] Fax: [phone]  Is this the correct pharmacy for this prescription? Yes If no, delete pharmacy and type the correct one.   Has the prescription been filled recently? No  Is the patient out of the medication? Yes  Has the patient been seen for an appointment in the last year OR does the patient have an upcoming appointment? Yes  Can we respond through MyChart? No  Agent: Please be advised that Rx refills may take up to 3 business days. We ask that you follow-up with your pharmacy.

## 2023-08-17 MED ORDER — HYDROCHLOROTHIAZIDE 25 MG PO TABS: 25.0000 mg | ORAL_TABLET | Freq: Every day | ORAL | 0 refills | Status: DC

## 2023-08-17 NOTE — Telephone Encounter (Signed)
 Courtesy refill. Patient will need an office visit for additional refills.  Requested Prescriptions  Pending Prescriptions Disp Refills   hydrochlorothiazide  (HYDRODIURIL ) 25 MG tablet 30 tablet 0    Sig: Take 1 tablet (25 mg total) by mouth daily.     Cardiovascular: Diuretics - Thiazide Failed - 08/17/2023  5:24 PM      Failed - Cr in normal range and within 180 days    Creat  Date Value Ref Range Status  11/28/2022 0.94 0.70 - 1.30 mg/dL Final         Failed - K in normal range and within 180 days    Potassium  Date Value Ref Range Status  11/28/2022 4.3 3.5 - 5.3 mmol/L Final         Failed - Na in normal range and within 180 days    Sodium  Date Value Ref Range Status  11/28/2022 138 135 - 146 mmol/L Final         Failed - Last BP in normal range    BP Readings from Last 1 Encounters:  11/28/22 (!) 146/82         Failed - Valid encounter within last 6 months    Recent Outpatient Visits           8 months ago Hypogonadism in male   [facility] Pickard, Cisco Crest, MD   1 year ago Hypogonadism in male    [facility] Pickard, Cisco Crest, MD

## 2023-08-28 ENCOUNTER — Ambulatory Visit: Admitting: Family Medicine

## 2023-08-28 ENCOUNTER — Encounter: Payer: Self-pay | Admitting: Family Medicine

## 2023-08-28 VITALS — BP 140/90 | HR 80 | Temp 99.0°F | Ht 72.0 in | Wt 233.4 lb

## 2023-08-28 DIAGNOSIS — I10 Essential (primary) hypertension: Secondary | ICD-10-CM

## 2023-08-28 DIAGNOSIS — E291 Testicular hypofunction: Secondary | ICD-10-CM

## 2023-08-28 MED ORDER — NEBIVOLOL HCL 5 MG PO TABS: 5.0000 mg | ORAL_TABLET | Freq: Every day | ORAL | 3 refills | Status: AC

## 2023-08-28 MED ORDER — HYDROCHLOROTHIAZIDE 25 MG PO TABS: 25.0000 mg | ORAL_TABLET | Freq: Every day | ORAL | 3 refills | Status: AC

## 2023-08-28 MED ORDER — TESTOSTERONE CYPIONATE 200 MG/ML IM SOLN: 150.0000 mg | INTRAMUSCULAR | 1 refills | Status: DC

## 2023-08-28 NOTE — Progress Notes (Signed)
 Subjective:    Patient ID: Miguel Mathis, male    DOB: 1970-12-05, 53 y.o.   MRN: [id number]  Patient is a 53 year old Caucasian male who presents today for a checkup.  He has a history of hypogonadism.  He is currently using testosterone  150 mg every 2 to 4 weeks.  He admits that he is not taking it consistently however when he does take it he feels much better.  I patient also has hypertension.  He is currently on Nebivolol  as well as hydrochlorothiazide .  He denies any chest pain shortness of breath or dyspnea on exertion.  He denies any syncope or near syncope. Past Medical History:  Diagnosis Date   Anxiety    Basal cell cancer    Hypertension    Hypogonadism in male    Past Surgical History:  Procedure Laterality Date   KNEE ARTHROSCOPY     VARICOCELE EXCISION     EARLY 30'S   Current Outpatient Medications on File Prior to Visit  Medication Sig Dispense Refill   MILK THISTLE PO Take 175 mg by mouth as needed. (Patient not taking: Reported on 08/28/2023)     OVER THE COUNTER MEDICATION daily. ALPHA BRAIN TAKE ONE CAPSULE (Patient not taking: Reported on 08/28/2023)     No current facility-administered medications on file prior to visit.    Allergies  Allergen Reactions   Diovan [Valsartan] Swelling   Social History   Socioeconomic History   Marital status: Married    Spouse name: Not on file   Number of children: Not on file   Years of education: Not on file   Highest education level: Some college, no degree  Occupational History   Not on file  Tobacco Use   Smoking status: Former    Types: Cigarettes   Smokeless tobacco: Current    Types: Snuff   Tobacco comments:    Lsast used snuff at 2200 on 04/20/21  Vaping Use   Vaping status: Never Used  Substance and Sexual Activity   Alcohol use: Yes    Alcohol/week: 6.0 standard drinks of alcohol    Types: 6 Cans of beer per week   Drug use: No   Sexual activity: Yes    Comment: married, 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[employer]   Other Topics Concern   Not on file  Social History Narrative   Not on file   Social Drivers of Health   Financial Resource Strain: Patient Declined (08/28/2023)   Overall Financial Resource Strain (CARDIA)    Difficulty of Paying Living Expenses: Patient declined  Food Insecurity: Patient Declined (08/28/2023)   Hunger Vital Sign    Worried About Running Out of Food in the Last Year: Patient declined    Ran Out of Food in the Last Year: Patient declined  Transportation Needs: Patient Declined (08/28/2023)   PRAPARE - Administrator, Civil Service (Medical): Patient declined    Lack of Transportation (Non-Medical): Patient declined  Physical Activity: Unknown (08/28/2023)   Exercise Vital Sign    Days of Exercise per Week: 2 days    Minutes of Exercise per Session: Patient declined  Stress: Patient Declined (08/28/2023)   [employer] of Occupational Health - Occupational Stress Questionnaire    Feeling of Stress: Patient declined  Social Connections: Unknown (08/28/2023)   Social Connection and Isolation Panel    Frequency of Communication with Friends and Family: Patient declined    Frequency of Social Gatherings with Friends and Family: Patient declined  Attends Religious Services: Patient declined    Active Member of Clubs or Organizations: Patient declined    Attends Banker Meetings: Not on file    Marital Status: Married  Catering manager Violence: Not on file   Family History  Problem Relation Age of Onset   Cancer Paternal Aunt        breast   Cancer Paternal Uncle        lung   Cancer Paternal Uncle        brain   Cancer Paternal Uncle        unknown to pt   Colon cancer Neg Hx    Colon polyps Neg Hx    Esophageal cancer Neg Hx    Rectal cancer Neg Hx    Stomach cancer Neg Hx       Review of Systems  All other systems reviewed and are negative.      Objective:   Physical Exam Vitals reviewed.  Constitutional:       General: He is not in acute distress.    Appearance: He is well-developed. He is not diaphoretic.  HENT:     Head: Normocephalic and atraumatic.     Right Ear: External ear normal.     Left Ear: External ear normal.     Nose: Nose normal.     Mouth/Throat:     Pharynx: No oropharyngeal exudate.   Eyes:     General: No scleral icterus.       Right eye: No discharge.        Left eye: No discharge.     Conjunctiva/sclera: Conjunctivae normal.     Pupils: Pupils are equal, round, and reactive to light.   Neck:     Thyroid: No thyromegaly.     Vascular: No JVD.     Trachea: No tracheal deviation.   Cardiovascular:     Rate and Rhythm: Normal rate and regular rhythm.     Heart sounds: Normal heart sounds. No murmur heard.    No friction rub. No gallop.  Pulmonary:     Effort: Pulmonary effort is normal. No respiratory distress.     Breath sounds: Normal breath sounds. No stridor. No wheezing or rales.  Chest:     Chest wall: No tenderness.  Abdominal:     General: Bowel sounds are normal. There is no distension.     Palpations: Abdomen is soft. There is no mass.     Tenderness: There is no abdominal tenderness. There is no guarding or rebound.   Musculoskeletal:        General: No tenderness. Normal range of motion.     Cervical back: Normal range of motion and neck supple.  Lymphadenopathy:     Cervical: No cervical adenopathy.   Skin:    General: Skin is warm.     Coloration: Skin is not pale.     Findings: No erythema or rash.   Neurological:     Mental Status: He is alert and oriented to person, place, and time.     Cranial Nerves: No cranial nerve deficit.     Motor: No abnormal muscle tone.     Coordination: Coordination normal.     Deep Tendon Reflexes: Reflexes are normal and symmetric.   Psychiatric:        Behavior: Behavior normal.        Thought Content: Thought content normal.        Judgment: Judgment normal.  Assessment & Plan:    Hypogonadism in male - Plan: CBC with Differential/Platelet, Lipid panel, Comprehensive metabolic panel with GFR, PSA, testosterone  cypionate (DEPOTESTOSTERONE CYPIONATE) 200 MG/ML injection  Benign essential HTN - Plan: CBC with Differential/Platelet, Lipid panel, Comprehensive metabolic panel with GFR  Primary hypertension - Plan: nebivolol  (BYSTOLIC ) 5 MG tablet I want the patient's blood pressure to be below 140/90.  I discussed this with him and recommended that he check his blood pressure at home to ensure he is meeting that requirement.  If not I would recommend adding additional medication to lower his blood pressure.  I monitor for any elevations in PSA or polycythemia secondary to testosterone  use.  Anticipate the testosterone  level will be slightly low given his inconsistency in administration.  Check fasting lipid panel.  Goal LDL cholesterol is less than 100.

## 2023-08-29 ENCOUNTER — Ambulatory Visit: Payer: Self-pay | Admitting: Family Medicine

## 2023-08-30 ENCOUNTER — Telehealth: Payer: Self-pay

## 2023-08-30 NOTE — Telephone Encounter (Signed)
 Copied from CRM [id number]. Topic: Clinical - Lab/Test Results >> Aug 30, 2023  3:56 PM Donna BRAVO wrote: Reason for CRM: patient called asking for lab results read verbatim: Butler ONEIDA Burr, MD 08/29/2023  6:57 AM EDT Back to Top  Labs look good except blood sugar, I would add hga1c  Erika Elida RAMAN, CMA to Miguel Mathis [facility]  Read verbatim:     08/29/23 12:02 PM Hi Miguel,   Dr Burr said that labs look overall good except your blood sugar was a little elevated. He wants to add on an A1c blood test to your existing lab work. It should be back within a day or so.   Thanks Kaoru Benda   Patient has no questions about lab results  Patient asked for MyChart password

## 2023-08-30 NOTE — Telephone Encounter (Signed)
 Sent pt a reset password text from MyChart.

## 2023-08-31 LAB — CBC WITH DIFFERENTIAL/PLATELET
Absolute Lymphocytes: 813 {cells}/uL — ABNORMAL LOW (ref 850–3900)
Absolute Monocytes: 1112 {cells}/uL — ABNORMAL HIGH (ref 200–950)
Basophils Absolute: 20 {cells}/uL (ref 0–200)
Basophils Relative: 0.3 %
Eosinophils Absolute: 52 {cells}/uL (ref 15–500)
Eosinophils Relative: 0.8 %
HCT: 51 % — ABNORMAL HIGH (ref 38.5–50.0)
Hemoglobin: 17 g/dL (ref 13.2–17.1)
MCH: 32.8 pg (ref 27.0–33.0)
MCHC: 33.3 g/dL (ref 32.0–36.0)
MCV: 98.3 fL (ref 80.0–100.0)
MPV: 10 fL (ref 7.5–12.5)
Monocytes Relative: 17.1 %
Neutro Abs: 4505 {cells}/uL (ref 1500–7800)
Neutrophils Relative %: 69.3 %
Platelets: 235 10*3/uL (ref 140–400)
RBC: 5.19 10*6/uL (ref 4.20–5.80)
RDW: 13.6 % (ref 11.0–15.0)
Total Lymphocyte: 12.5 %
WBC: 6.5 10*3/uL (ref 3.8–10.8)

## 2023-08-31 LAB — COMPREHENSIVE METABOLIC PANEL WITH GFR
AG Ratio: 1.6 (calc) (ref 1.0–2.5)
ALT: 37 U/L (ref 9–46)
AST: 34 U/L (ref 10–35)
Albumin: 4.9 g/dL (ref 3.6–5.1)
Alkaline phosphatase (APISO): 57 U/L (ref 35–144)
BUN: 8 mg/dL (ref 7–25)
CO2: 29 mmol/L (ref 20–32)
Calcium: 9.9 mg/dL (ref 8.6–10.3)
Chloride: 100 mmol/L (ref 98–110)
Creat: 0.93 mg/dL (ref 0.70–1.30)
Globulin: 3 g/dL (ref 1.9–3.7)
Glucose, Bld: 120 mg/dL — ABNORMAL HIGH (ref 65–99)
Potassium: 4.7 mmol/L (ref 3.5–5.3)
Sodium: 141 mmol/L (ref 135–146)
Total Bilirubin: 0.5 mg/dL (ref 0.2–1.2)
Total Protein: 7.9 g/dL (ref 6.1–8.1)
eGFR: 98 mL/min/{1.73_m2} (ref >=60)

## 2023-08-31 LAB — LIPID PANEL
Cholesterol: 214 mg/dL — ABNORMAL HIGH (ref <200)
HDL: 128 mg/dL (ref >=40)
LDL Cholesterol (Calc): 73 mg/dL
Non-HDL Cholesterol (Calc): 86 mg/dL (ref <130)
Total CHOL/HDL Ratio: 1.7 (calc) (ref <5.0)
Triglycerides: 58 mg/dL (ref <150)

## 2023-08-31 LAB — HEMOGLOBIN A1C
Hgb A1c MFr Bld: 5.2 % (ref <5.7)
Mean Plasma Glucose: 103 mg/dL
eAG (mmol/L): 5.7 mmol/L

## 2023-08-31 LAB — TEST AUTHORIZATION

## 2023-08-31 LAB — PSA: PSA: 0.49 ng/mL (ref <=4.00)

## 2023-09-09 ENCOUNTER — Other Ambulatory Visit: Payer: Self-pay | Admitting: Family Medicine

## 2023-09-09 DIAGNOSIS — I10 Essential (primary) hypertension: Secondary | ICD-10-CM

## 2023-09-22 ENCOUNTER — Other Ambulatory Visit: Payer: Self-pay | Admitting: Family Medicine

## 2023-09-22 NOTE — Telephone Encounter (Signed)
 Rx 08/28/23 #90 3RF- duplicate request Requested Prescriptions  Refused Prescriptions Disp Refills   hydrochlorothiazide  (HYDRODIURIL ) 25 MG tablet [Pharmacy Med Name: HYDROCHLOROTHIAZIDE  25 MG TAB] 30 tablet     Sig: TAKE 1 TABLET (25 MG TOTAL) BY MOUTH DAILY.     Cardiovascular: Diuretics - Thiazide Failed - 09/22/2023  4:19 PM      Failed - Last BP in normal range    BP Readings from Last 1 Encounters:  08/28/23 (!) 140/90         Passed - Cr in normal range and within 180 days    Creat  Date Value Ref Range Status  08/28/2023 0.93 0.70 - 1.30 mg/dL Final         Passed - K in normal range and within 180 days    Potassium  Date Value Ref Range Status  08/28/2023 4.7 3.5 - 5.3 mmol/L Final         Passed - Na in normal range and within 180 days    Sodium  Date Value Ref Range Status  08/28/2023 141 135 - 146 mmol/L Final         Passed - Valid encounter within last 6 months    Recent Outpatient Visits           3 weeks ago Hypogonadism in male   [place] [facility] Pickard, Butler DASEN, MD   9 months ago Hypogonadism in male   [facility] Pickard, Butler DASEN, MD   1 year ago Hypogonadism in male   [place] [facility] Pickard, Butler DASEN, MD

## 2024-02-11 ENCOUNTER — Other Ambulatory Visit: Payer: Self-pay | Admitting: Family Medicine

## 2024-02-11 DIAGNOSIS — E291 Testicular hypofunction: Secondary | ICD-10-CM
